# Patient Record
Sex: Male | Born: 1965
Health system: Southern US, Community
[De-identification: ages and names within clinical notes are randomized; demographics above are authoritative.]

## PROBLEM LIST (undated history)

## (undated) ENCOUNTER — Emergency Department: Admission: EM | Payer: Managed Care, Other (non HMO) | Source: Home / Self Care

## (undated) DIAGNOSIS — R809 Proteinuria, unspecified: Secondary | ICD-10-CM

## (undated) DIAGNOSIS — R030 Elevated blood-pressure reading, without diagnosis of hypertension: Secondary | ICD-10-CM

## (undated) DIAGNOSIS — F411 Generalized anxiety disorder: Secondary | ICD-10-CM

## (undated) DIAGNOSIS — J45909 Unspecified asthma, uncomplicated: Secondary | ICD-10-CM

## (undated) DIAGNOSIS — K429 Umbilical hernia without obstruction or gangrene: Secondary | ICD-10-CM

## (undated) DIAGNOSIS — L989 Disorder of the skin and subcutaneous tissue, unspecified: Secondary | ICD-10-CM

## (undated) DIAGNOSIS — E785 Hyperlipidemia, unspecified: Secondary | ICD-10-CM

## (undated) DIAGNOSIS — R972 Elevated prostate specific antigen [PSA]: Secondary | ICD-10-CM

## (undated) DIAGNOSIS — E119 Type 2 diabetes mellitus without complications: Secondary | ICD-10-CM

## (undated) DIAGNOSIS — K219 Gastro-esophageal reflux disease without esophagitis: Secondary | ICD-10-CM

## (undated) DIAGNOSIS — I2699 Other pulmonary embolism without acute cor pulmonale: Secondary | ICD-10-CM

## (undated) DIAGNOSIS — Z87442 Personal history of urinary calculi: Secondary | ICD-10-CM

## (undated) HISTORY — DX: Elevated prostate specific antigen (PSA): R97.20

## (undated) HISTORY — DX: Umbilical hernia without obstruction or gangrene: K42.9

## (undated) HISTORY — DX: Hyperlipidemia, unspecified: E78.5

## (undated) HISTORY — DX: Gastro-esophageal reflux disease without esophagitis: K21.9

## (undated) HISTORY — DX: Unspecified asthma, uncomplicated: J45.909

## (undated) HISTORY — DX: Generalized anxiety disorder: F41.1

## (undated) HISTORY — DX: Type 2 diabetes mellitus without complications: E11.9

## (undated) HISTORY — PX: OTHER SURGICAL HISTORY: SHX169

## (undated) HISTORY — DX: Disorder of the skin and subcutaneous tissue, unspecified: L98.9

## (undated) HISTORY — DX: Personal history of urinary calculi: Z87.442

## (undated) HISTORY — DX: Proteinuria, unspecified: R80.9

## (undated) HISTORY — DX: Elevated blood-pressure reading, without diagnosis of hypertension: R03.0

## (undated) HISTORY — DX: Other pulmonary embolism without acute cor pulmonale: I26.99

---

## 2004-09-22 ENCOUNTER — Ambulatory Visit: Payer: Self-pay | Admitting: Internal Medicine

## 2005-06-18 ENCOUNTER — Ambulatory Visit: Payer: Self-pay | Admitting: Internal Medicine

## 2007-02-20 ENCOUNTER — Ambulatory Visit: Payer: Self-pay | Admitting: Internal Medicine

## 2007-02-20 LAB — CONVERTED CEMR LAB
Albumin: 3.9 g/dL (ref 3.5–5.2)
Alkaline Phosphatase: 76 units/L (ref 39–117)
BUN: 19 mg/dL (ref 6–23)
Basophils Relative: 0.1 % (ref 0.0–1.0)
Cholesterol: 202 mg/dL (ref 0–200)
Crystals: NEGATIVE
Direct LDL: 131.5 mg/dL
GFR calc Af Amer: 106 mL/min
HDL: 31.4 mg/dL — ABNORMAL LOW (ref >39.0)
Hgb A1c MFr Bld: 7.9 % — ABNORMAL HIGH (ref 4.6–6.0)
Leukocytes, UA: NEGATIVE
Lymphocytes Relative: 31.4 % (ref 12.0–46.0)
Monocytes Relative: 10.8 % (ref 3.0–11.0)
Neutro Abs: 6 10*3/uL (ref 1.4–7.7)
PSA: 0.65 ng/mL (ref 0.10–4.00)
Platelets: 266 10*3/uL (ref 150–400)
Potassium: 4.5 meq/L (ref 3.5–5.1)
Specific Gravity, Urine: >=1.03 (ref 1.000–1.03)
Total CHOL/HDL Ratio: 6.4
Urine Glucose: 500 mg/dL — AB
Urobilinogen, UA: 0.2 (ref 0.0–1.0)
VLDL: 51 mg/dL — ABNORMAL HIGH (ref 0–40)
pH: 5.5 (ref 5.0–8.0)

## 2007-07-17 ENCOUNTER — Ambulatory Visit: Payer: Self-pay | Admitting: Internal Medicine

## 2007-07-17 DIAGNOSIS — E785 Hyperlipidemia, unspecified: Secondary | ICD-10-CM

## 2007-07-17 DIAGNOSIS — E119 Type 2 diabetes mellitus without complications: Secondary | ICD-10-CM

## 2007-07-17 DIAGNOSIS — K219 Gastro-esophageal reflux disease without esophagitis: Secondary | ICD-10-CM

## 2007-07-17 DIAGNOSIS — Z87442 Personal history of urinary calculi: Secondary | ICD-10-CM | POA: Insufficient documentation

## 2007-07-17 DIAGNOSIS — E1165 Type 2 diabetes mellitus with hyperglycemia: Secondary | ICD-10-CM | POA: Insufficient documentation

## 2007-07-17 DIAGNOSIS — R809 Proteinuria, unspecified: Secondary | ICD-10-CM | POA: Insufficient documentation

## 2007-07-17 DIAGNOSIS — F411 Generalized anxiety disorder: Secondary | ICD-10-CM | POA: Insufficient documentation

## 2007-07-17 HISTORY — DX: Type 2 diabetes mellitus without complications: E11.9

## 2007-07-17 HISTORY — DX: Proteinuria, unspecified: R80.9

## 2007-07-17 HISTORY — DX: Hyperlipidemia, unspecified: E78.5

## 2007-07-17 HISTORY — DX: Gastro-esophageal reflux disease without esophagitis: K21.9

## 2007-07-17 HISTORY — DX: Personal history of urinary calculi: Z87.442

## 2007-07-17 HISTORY — DX: Generalized anxiety disorder: F41.1

## 2007-07-21 LAB — CONVERTED CEMR LAB
BUN: 11 mg/dL (ref 6–23)
CO2: 27 meq/L (ref 19–32)
Chloride: 107 meq/L (ref 96–112)
Cholesterol: 184 mg/dL (ref 0–200)
Creatinine, Ser: 0.9 mg/dL (ref 0.4–1.5)
Direct LDL: 126 mg/dL
HDL: 35.8 mg/dL — ABNORMAL LOW (ref >39.0)
Hgb A1c MFr Bld: 7 % — ABNORMAL HIGH (ref 4.6–6.0)
Microalb, Ur: 0.3 mg/dL (ref 0.0–1.9)
Potassium: 4.2 meq/L (ref 3.5–5.1)
Sodium: 141 meq/L (ref 135–145)

## 2007-07-22 ENCOUNTER — Telehealth (INDEPENDENT_AMBULATORY_CARE_PROVIDER_SITE_OTHER): Payer: Self-pay | Admitting: *Deleted

## 2007-09-19 ENCOUNTER — Telehealth (INDEPENDENT_AMBULATORY_CARE_PROVIDER_SITE_OTHER): Payer: Self-pay | Admitting: *Deleted

## 2007-10-13 ENCOUNTER — Ambulatory Visit: Payer: Self-pay | Admitting: Internal Medicine

## 2007-10-13 DIAGNOSIS — R030 Elevated blood-pressure reading, without diagnosis of hypertension: Secondary | ICD-10-CM | POA: Insufficient documentation

## 2007-10-13 HISTORY — DX: Elevated blood-pressure reading, without diagnosis of hypertension: R03.0

## 2008-05-29 ENCOUNTER — Inpatient Hospital Stay: Payer: Self-pay | Admitting: Internal Medicine

## 2008-06-01 ENCOUNTER — Encounter: Payer: Self-pay | Admitting: Internal Medicine

## 2008-06-01 ENCOUNTER — Telehealth: Payer: Self-pay | Admitting: Internal Medicine

## 2008-06-02 ENCOUNTER — Ambulatory Visit: Payer: Self-pay | Admitting: Internal Medicine

## 2008-06-02 DIAGNOSIS — I2699 Other pulmonary embolism without acute cor pulmonale: Secondary | ICD-10-CM | POA: Insufficient documentation

## 2008-06-02 HISTORY — DX: Other pulmonary embolism without acute cor pulmonale: I26.99

## 2008-06-02 LAB — CONVERTED CEMR LAB: Prothrombin Time: 15.4 s — ABNORMAL HIGH (ref 10.9–13.3)

## 2008-06-04 ENCOUNTER — Ambulatory Visit: Payer: Self-pay | Admitting: Cardiology

## 2008-06-04 LAB — CONVERTED CEMR LAB: INR: 1.8 — ABNORMAL HIGH (ref 0.8–1.0)

## 2008-06-07 ENCOUNTER — Ambulatory Visit: Payer: Self-pay | Admitting: Cardiology

## 2008-06-10 ENCOUNTER — Ambulatory Visit: Payer: Self-pay | Admitting: Cardiology

## 2008-06-11 ENCOUNTER — Ambulatory Visit: Payer: Self-pay | Admitting: Internal Medicine

## 2008-06-14 ENCOUNTER — Ambulatory Visit: Payer: Self-pay | Admitting: Internal Medicine

## 2008-06-24 ENCOUNTER — Ambulatory Visit: Payer: Self-pay | Admitting: Cardiovascular Disease

## 2008-07-05 ENCOUNTER — Ambulatory Visit: Payer: Self-pay | Admitting: Cardiovascular Disease

## 2008-07-13 ENCOUNTER — Ambulatory Visit: Payer: Self-pay | Admitting: Cardiovascular Disease

## 2008-08-31 ENCOUNTER — Ambulatory Visit: Payer: Self-pay | Admitting: Internal Medicine

## 2008-09-01 LAB — CONVERTED CEMR LAB
BUN: 14 mg/dL (ref 6–23)
Calcium: 9.2 mg/dL (ref 8.4–10.5)
Chloride: 105 meq/L (ref 96–112)
Cholesterol: 142 mg/dL (ref 0–200)
Creatinine, Ser: 0.9 mg/dL (ref 0.4–1.5)
GFR calc Af Amer: 120 mL/min
GFR calc non Af Amer: 99 mL/min
Hgb A1c MFr Bld: 7.3 % — ABNORMAL HIGH (ref 4.6–6.0)
LDL Cholesterol: 75 mg/dL (ref 0–99)
Total CHOL/HDL Ratio: 3.8
Triglycerides: 149 mg/dL (ref 0–149)
VLDL: 30 mg/dL (ref 0–40)

## 2008-09-24 ENCOUNTER — Ambulatory Visit: Payer: Self-pay | Admitting: Internal Medicine

## 2008-10-11 ENCOUNTER — Telehealth: Payer: Self-pay | Admitting: Internal Medicine

## 2009-01-25 ENCOUNTER — Encounter: Payer: Self-pay | Admitting: *Deleted

## 2009-02-11 ENCOUNTER — Encounter (INDEPENDENT_AMBULATORY_CARE_PROVIDER_SITE_OTHER): Payer: Self-pay | Admitting: Cardiology

## 2009-02-11 ENCOUNTER — Ambulatory Visit: Payer: Self-pay | Admitting: Cardiology

## 2009-02-11 LAB — CONVERTED CEMR LAB: Protime: 19.5

## 2009-03-02 ENCOUNTER — Encounter: Payer: Self-pay | Admitting: *Deleted

## 2009-04-06 ENCOUNTER — Encounter: Payer: Self-pay | Admitting: Cardiology

## 2009-04-07 ENCOUNTER — Encounter (INDEPENDENT_AMBULATORY_CARE_PROVIDER_SITE_OTHER): Payer: Self-pay | Admitting: Cardiology

## 2009-05-13 ENCOUNTER — Encounter (INDEPENDENT_AMBULATORY_CARE_PROVIDER_SITE_OTHER): Payer: Self-pay | Admitting: *Deleted

## 2009-07-12 ENCOUNTER — Encounter (INDEPENDENT_AMBULATORY_CARE_PROVIDER_SITE_OTHER): Payer: Self-pay | Admitting: Pharmacist

## 2009-09-16 ENCOUNTER — Encounter (INDEPENDENT_AMBULATORY_CARE_PROVIDER_SITE_OTHER): Payer: Self-pay | Admitting: Cardiology

## 2009-09-22 ENCOUNTER — Encounter: Payer: Self-pay | Admitting: Internal Medicine

## 2009-09-22 ENCOUNTER — Telehealth (INDEPENDENT_AMBULATORY_CARE_PROVIDER_SITE_OTHER): Payer: Self-pay | Admitting: *Deleted

## 2009-10-03 ENCOUNTER — Ambulatory Visit: Payer: Self-pay | Admitting: Internal Medicine

## 2009-10-03 DIAGNOSIS — R972 Elevated prostate specific antigen [PSA]: Secondary | ICD-10-CM | POA: Insufficient documentation

## 2009-10-03 HISTORY — DX: Elevated prostate specific antigen (PSA): R97.20

## 2009-10-04 ENCOUNTER — Telehealth (INDEPENDENT_AMBULATORY_CARE_PROVIDER_SITE_OTHER): Payer: Self-pay | Admitting: *Deleted

## 2009-10-04 LAB — CONVERTED CEMR LAB
Albumin: 4.3 g/dL (ref 3.5–5.2)
Alkaline Phosphatase: 80 units/L (ref 39–117)
BUN: 13 mg/dL (ref 6–23)
Basophils Absolute: 0 10*3/uL (ref 0.0–0.1)
Basophils Relative: 0.2 % (ref 0.0–3.0)
Bilirubin, Direct: 0.1 mg/dL (ref 0.0–0.3)
CO2: 26 meq/L (ref 19–32)
Calcium: 9.1 mg/dL (ref 8.4–10.5)
Chloride: 107 meq/L (ref 96–112)
Creatinine, Ser: 0.8 mg/dL (ref 0.4–1.5)
Creatinine,U: 52.8 mg/dL
Eosinophils Absolute: 0.1 10*3/uL (ref 0.0–0.7)
Glucose, Bld: 145 mg/dL — ABNORMAL HIGH (ref 70–99)
HDL: 36.8 mg/dL — ABNORMAL LOW (ref >39.00)
Lymphocytes Relative: 19.9 % (ref 12.0–46.0)
MCHC: 33.4 g/dL (ref 30.0–36.0)
MCV: 94.4 fL (ref 78.0–100.0)
Microalb Creat Ratio: 24.6 mg/g (ref 0.0–30.0)
Monocytes Absolute: 0.8 10*3/uL (ref 0.1–1.0)
Neutrophils Relative %: 70.9 % (ref 43.0–77.0)
Nitrite: NEGATIVE
RDW: 12 % (ref 11.5–14.6)
Specific Gravity, Urine: 1.015 (ref 1.000–1.030)
TSH: 0.81 microintl units/mL (ref 0.35–5.50)
Total CHOL/HDL Ratio: 4
Total Protein: 7.5 g/dL (ref 6.0–8.3)
Triglycerides: 146 mg/dL (ref 0.0–149.0)
Urobilinogen, UA: 0.2 (ref 0.0–1.0)
VLDL: 29.2 mg/dL (ref 0.0–40.0)

## 2009-11-07 ENCOUNTER — Encounter: Payer: Self-pay | Admitting: Internal Medicine

## 2010-01-10 ENCOUNTER — Encounter: Payer: Self-pay | Admitting: Internal Medicine

## 2010-02-20 IMAGING — US US EXTREM LOW VENOUS BILAT
1 series · 17 of 24 positions shown · non-contrast
Comparison: none

REASON FOR EXAM: sob, edema
COMMENTS:

PROCEDURE:     US  - US DOPPLER LOW EXTR BILATERAL  - May 29, 2008  [DATE]
RESULT:     Comparison: None
INDICATION: Short of breath

[Series 1: us extrem low venous bilat · 17 of 46 slices shown]
[im 1/46]
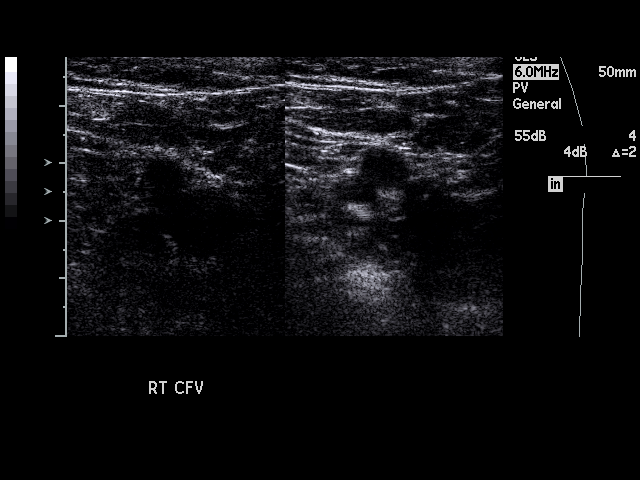
[im 4/46]
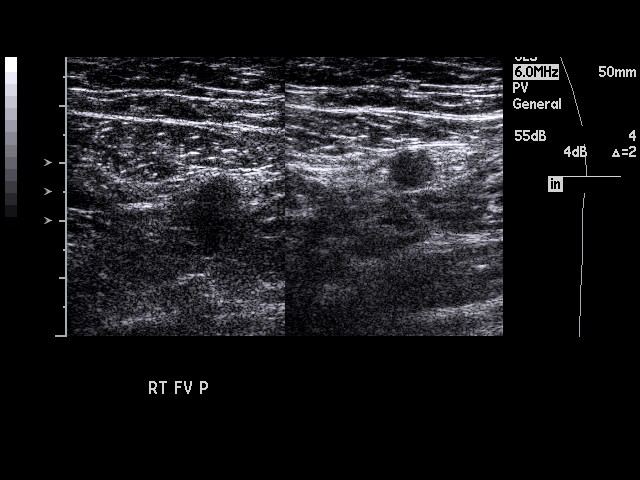
[im 6/46]
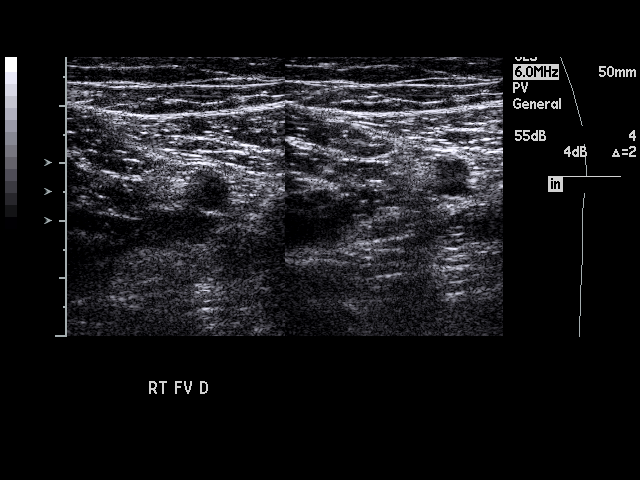
[im 8/46]
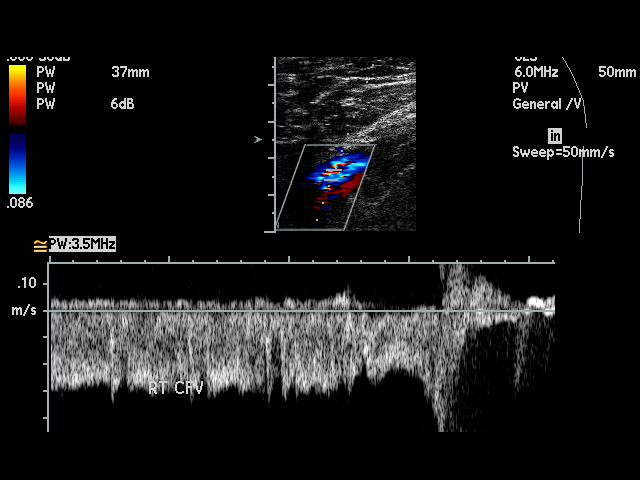
[im 12/46]
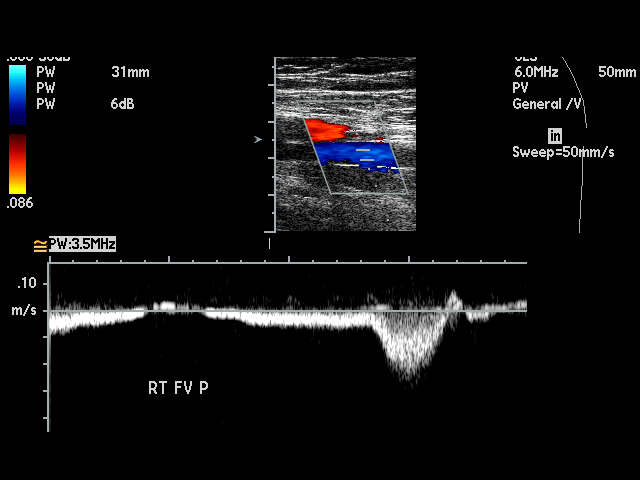
[im 14/46]
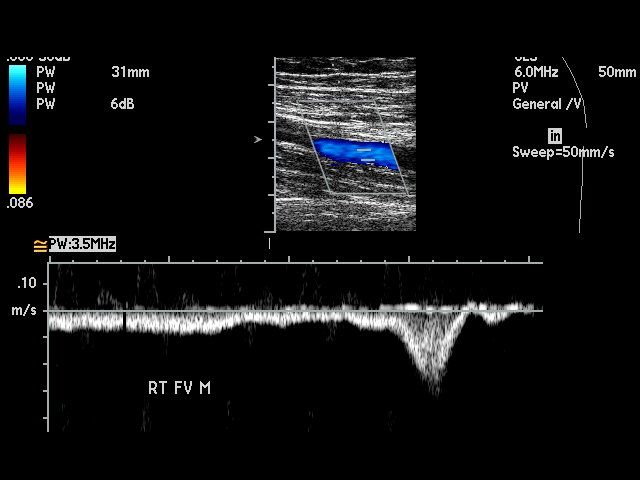
[im 18/46]
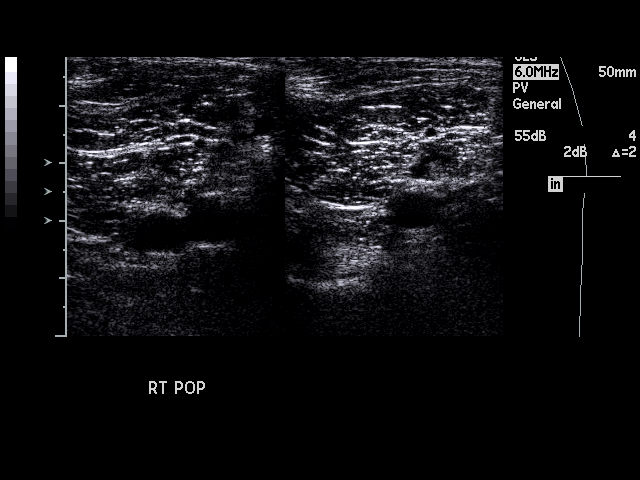
[im 20/46]
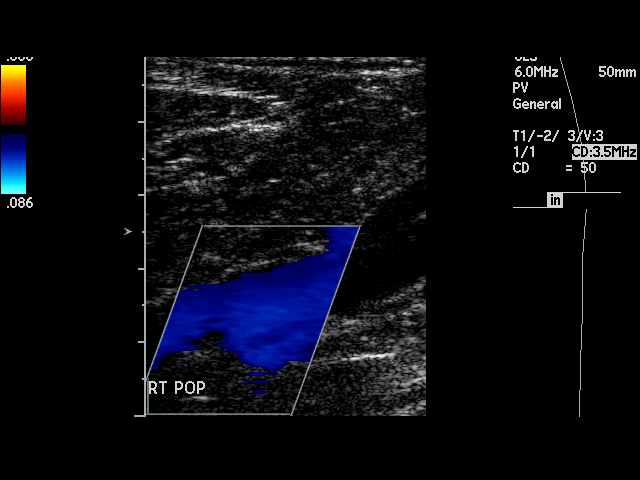
[im 24/46]
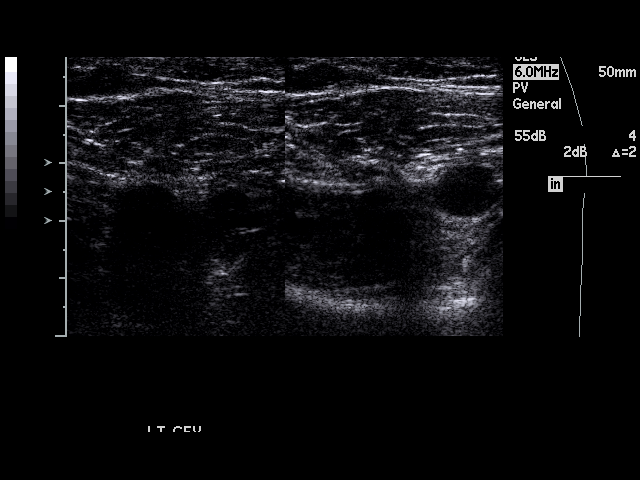
[im 26/46]
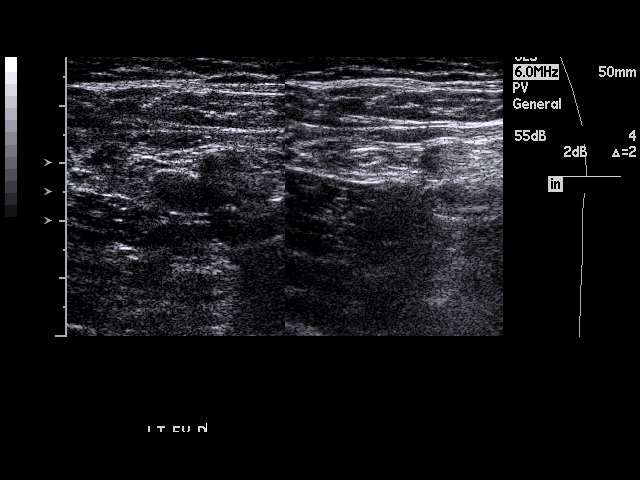
[im 28/46]
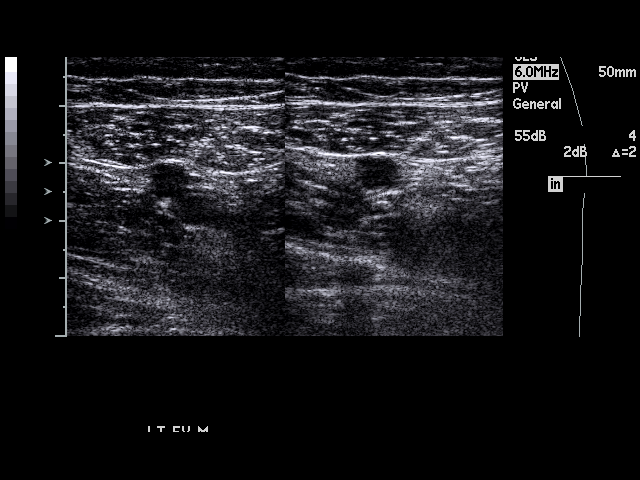
[im 32/46]
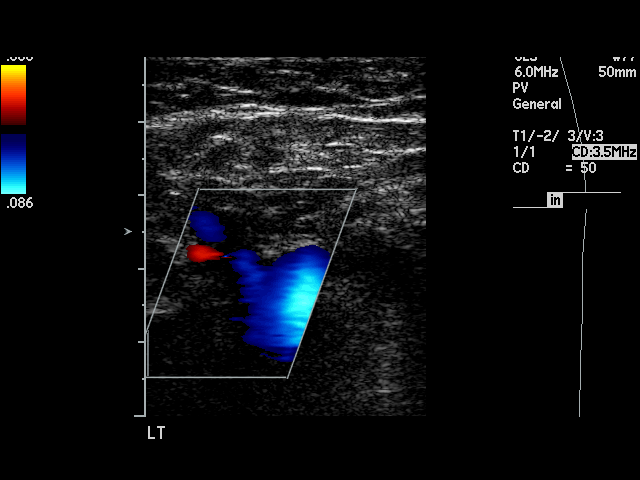
[im 34/46]
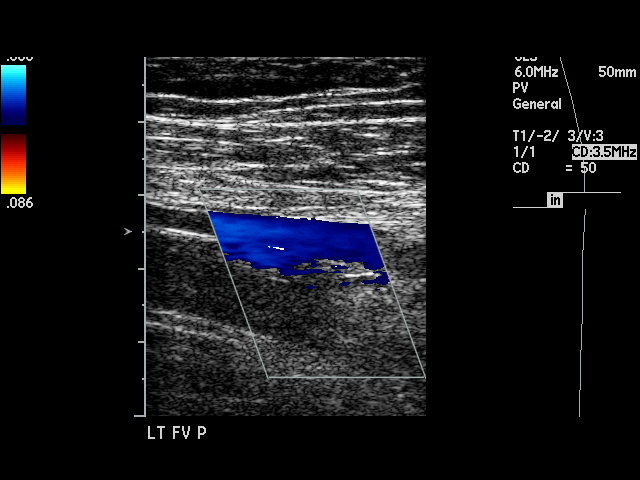
[im 38/46]
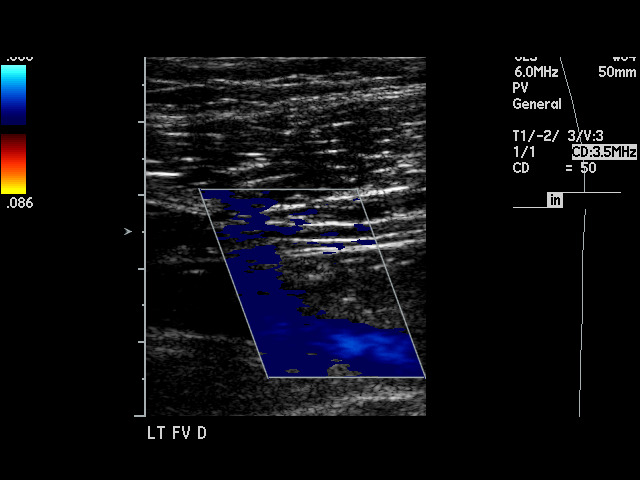
[im 40/46]
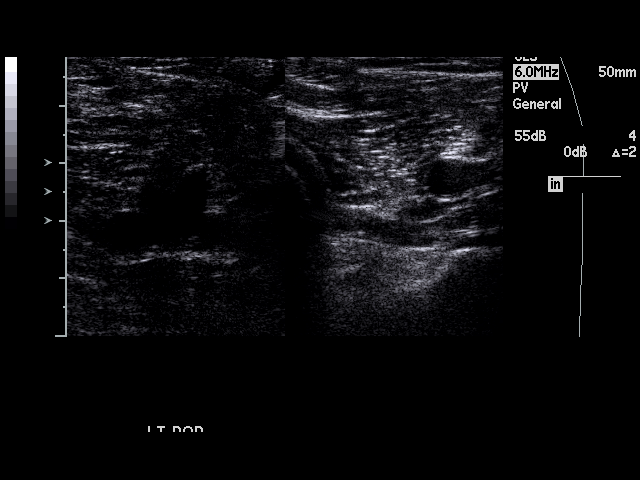
[im 42/46]
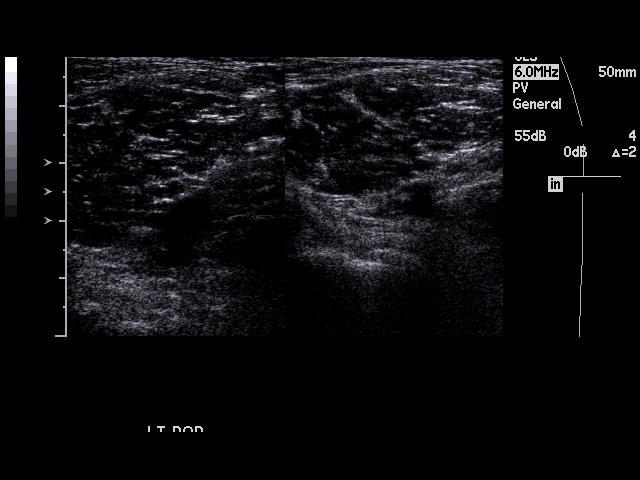
[im 46/46]
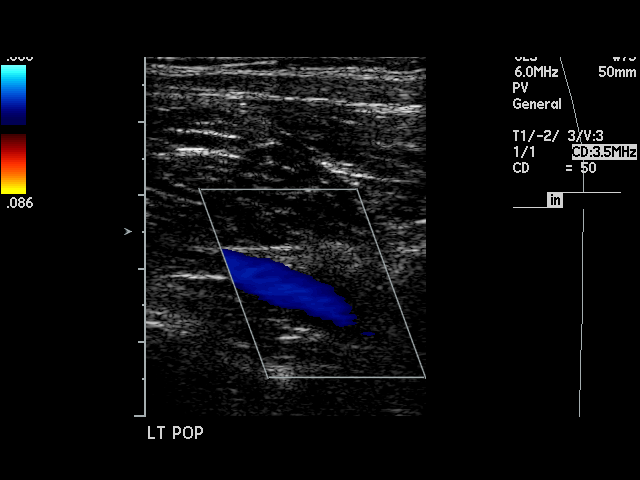

[17 of 24 positions shown; findings below may reference images not displayed]

FINDINGS: Multiple longitudinal and transverse gray-scale as well as color
and spectral Doppler images of the bilateral lower extremity veins were
obtained from the common femoral veins through the popliteal veins.

The right common femoral, greater saphenous, femoral, popliteal veins, and
venous trifurcation are patent, demonstrating normal color-flow and
compressibility. No intraluminal thrombus is identified.There is normal
respiratory variation and augmentation demonstrated at all levels.

The left common femoral, greater saphenous, femoral, popliteal veins, and
venous trifurcation are patent, demonstrating normal color-flow and
compressibility. No intraluminal thrombus is identified.There is normal
respiratory variation and augmentation demonstrated at all levels.
IMPRESSION: No evidence of DVT in the bilateral lower extremities.

## 2010-04-03 ENCOUNTER — Ambulatory Visit: Payer: Self-pay | Admitting: Internal Medicine

## 2010-08-03 ENCOUNTER — Ambulatory Visit: Payer: Self-pay | Admitting: Internal Medicine

## 2010-08-04 ENCOUNTER — Encounter: Payer: Self-pay | Admitting: Internal Medicine

## 2010-08-04 ENCOUNTER — Ambulatory Visit: Payer: Self-pay | Admitting: Internal Medicine

## 2010-08-04 DIAGNOSIS — L989 Disorder of the skin and subcutaneous tissue, unspecified: Secondary | ICD-10-CM

## 2010-08-04 HISTORY — DX: Disorder of the skin and subcutaneous tissue, unspecified: L98.9

## 2010-08-08 LAB — CONVERTED CEMR LAB
BUN: 14 mg/dL (ref 6–23)
CO2: 27 meq/L (ref 19–32)
Calcium: 9.4 mg/dL (ref 8.4–10.5)
GFR calc non Af Amer: 83.55 mL/min (ref >60.00)
Glucose, Bld: 132 mg/dL — ABNORMAL HIGH (ref 70–99)
HDL: 32.5 mg/dL — ABNORMAL LOW (ref >39.00)
Hgb A1c MFr Bld: 7.7 % — ABNORMAL HIGH (ref 4.6–6.5)
Sodium: 138 meq/L (ref 135–145)
Total CHOL/HDL Ratio: 4
VLDL: 33.8 mg/dL (ref 0.0–40.0)

## 2010-09-26 NOTE — Progress Notes (Signed)
       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  Follow-up by: Bethena Midget, RN, BSN,  September 22, 2009 2:25 PM

## 2010-09-26 NOTE — Medication Information (Signed)
Summary: [facility]  Anticoagulant Therapy  Managed by: Inactive Referring MD: Corwin Levins MD Supervising MD: Graciela Husbands MD, Viviann Spare Indication 1: Deep Vein Thrombosis - Leg (ICD-451.1) Indication 2: Pulmonary Embolism and Infarction (ICD-415.1) Lab Used: [facility] Site: [facility] INR RANGE 2 - 3          Comments: Called pt, hasn't had PT/INR done since 8/10.  Pt states he stopped coumadin in October 2010, and feels fine.  Advised pt of risks assoc with stopping coumadin including clot and death.  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  Pt states he is unable to physically follow-up.  Will forward copy of note to Dr Jonny Ruiz to make aware.  Cloyde Reams RN  September 22, 2009 2:50 PM   Allergies: No Known Drug Allergies  Anticoagulation Management History:      Positive risk factors for bleeding include presence of serious comorbidities.  Negative risk factors for bleeding include an age less than 2 years old.  The bleeding index is 'intermediate risk'.  Positive CHADS2 values include History of Diabetes.  Negative CHADS2 values include Age > 82 years old.  The start date was 06/02/2008.  His last INR was 4.8 RATIO.  Anticoagulation responsible provider: Graciela Husbands MD, Viviann Spare.  Exp: 02/2010.    Anticoagulation Management Assessment/Plan:      The patient's current anticoagulation dose is Coumadin 5 mg tabs: use asd by mouth once daily.  The target INR is 2 - 3.  The next INR is due 03/04/2009.  Anticoagulation instructions were given to patient.  Results were reviewed/authorized by Inactive.         Prior Anticoagulation Instructions: INR today 2.6  Continue regular schedule of 2 tablets (10mg ) daily.

## 2010-09-26 NOTE — Assessment & Plan Note (Signed)
Summary: FU---STC   Vital Signs:  Patient profile:   45 year old male Height:      71 inches Weight:      232 pounds BMI:     28.00 O2 Sat:      95 % on Room air Temp:     96.9 degrees F oral Pulse rate:   92 / minute BP sitting:   128 / 82  (left arm) Cuff size:   large  Vitals Entered ByZella Ball Ewing (October 03, 2009 8:56 AM)  O2 Flow:  Room air  CC: followup/RE   CC:  followup/RE.  History of Present Illness: ran out of meds one month ago ;  Pt denies CP, sob, doe, wheezing, orthopnea, pnd, worsening LE edema, palps, dizziness or syncope  Pt denies new neuro symptoms such as headache, facial or extremity weakness   Pt denies polydipsia, polyuria, or low sugar symptoms such as shakiness improved with eating.  Overall good compliance with meds, trying to follow low chol, DM diet, wt stable, little excercise however Still driving long distance.  No other new complaints at this time  Preventive Screening-Counseling & Management      Drug Use:  no.    Problems Prior to Update: 1)  Special Screening Malig Neoplasms Other Sites  (ICD-V76.49) 2)  Preventive Health Care  (ICD-V70.0) 3)  Pe  (ICD-415.19) 4)  Elevated Blood Pressure Without Diagnosis of Hypertension  (ICD-796.2) 5)  Anxiety  (ICD-300.00) 6)  Gerd  (ICD-530.81) 7)  Nephrolithiasis, Hx of  (ICD-V13.01) 8)  Proteinuria  (ICD-791.0) 9)  Family History Diabetes 1st Degree Relative  (ICD-V18.0) 10)  Hyperlipidemia  (ICD-272.4) 11)  Diabetes Mellitus, Type II  (ICD-250.00)  Medications Prior to Update: 1)  Simvastatin 40 Mg  Tabs (Simvastatin) .Marland Kitchen.. 1po Qd 2)  Metformin Hcl 500 Mg  Tabs (Metformin Hcl) .... 2 By Mouth Two Times A Day 3)  Onetouch Ultra Test   Strp (Glucose Blood) .... Use Asd Qid 4)  Lancets   Misc (Lancets) .... Use Asd Qid 5)  Coumadin 5 Mg Tabs (Warfarin Sodium) .... Use Asd By Mouth Once Daily  Current Medications (verified): 1)  Simvastatin 40 Mg  Tabs (Simvastatin) .Marland Kitchen.. 1po Once Daily 2)   Metformin Hcl 500 Mg  Tabs (Metformin Hcl) .... 2 By Mouth Two Times A Day 3)  Onetouch Ultra Test   Strp (Glucose Blood) .... Use Asd Qid 4)  Lancets   Misc (Lancets) .... Use Asd Qid 5)  Aspir-Low 81 Mg Tbec (Aspirin) .Marland Kitchen.. 1 By Mouth Once Daily  Allergies (verified): No Known Drug Allergies  Past History:  Past Medical History: Last updated: 08/31/2008 Diabetes mellitus, type II Hyperlipidemia Nephrolithiasis, hx of GERD Anxiety ? 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         all otherwise negative per pt -   Physical Exam  General:  alert and overweight-appearing.   Head:  normocephalic and atraumatic.   Eyes:  vision grossly intact, pupils equal, and pupils round.   Ears:  R ear normal and L ear normal.   Nose:  no  external deformity and no nasal discharge.   Mouth:  no gingival abnormalities and pharynx pink and moist.   Neck:  supple and no masses.   Lungs:  normal respiratory effort and normal breath sounds.   Heart:  normal rate and regular rhythm.   Abdomen:  soft, non-tender, and normal bowel sounds, has rather large but nontender umbilical hernia and easily reducible Msk:  no joint tenderness and no joint swelling.   Extremities:  no edema, no erythema  Neurologic:  cranial nerves II-XII intact and strength normal in all extremities.     Impression & Recommendations:  Problem # 1:  Preventive Health Care (ICD-V70.0)  Overall doing well, age appropriate education and counseling updated and referral for appropriate preventive services done unless declined, immunizations up to date or declined, diet counseling done if overweight, urged to quit smoking if smokes , most recent labs reviewed and current ordered if appropriate, ecg reviewed or declined (interpretation per ECG scanned in the EMR if done); information regarding Medicare Prevention requirements given if appropriate   Orders: TLB-BMP (Basic Metabolic Panel-BMET) (80048-METABOL) TLB-CBC Platelet - w/Differential (85025-CBCD) TLB-Hepatic/Liver Function Pnl (80076-HEPATIC) TLB-Lipid Panel (80061-LIPID) TLB-TSH (Thyroid Stimulating Hormone) (84443-TSH) TLB-PSA (Prostate Specific Antigen) (84153-PSA) TLB-Udip ONLY (81003-UDIP)  Problem # 2:  DIABETES MELLITUS, TYPE II (ICD-250.00)  His updated medication list for this problem includes:    Metformin Hcl 500 Mg Tabs (Metformin hcl) .Marland Kitchen... 2 by mouth two times a day    Aspir-low 81 Mg Tbec (Aspirin) .Marland Kitchen... 1 by mouth once daily  Orders: TLB-Microalbumin/Creat Ratio, Urine (82043-MALB) TLB-A1C / Hgb A1C (Glycohemoglobin) (83036-A1C)  Labs Reviewed: Creat: 0.9 (08/31/2008)    Reviewed HgBA1c results: 7.3 (08/31/2008)  7.0 (07/17/2007) stable overall by hx and exam, ok to continue  meds/tx as is , Pt to cont DM diet, excercise, wt loss efforts; to check labs today   Problem # 3:  ELEVATED BLOOD PRESSURE WITHOUT DIAGNOSIS OF HYPERTENSION (ICD-796.2)  BP today: 128/82 Prior BP: 136/88 (08/31/2008)  Labs Reviewed: Creat: 0.9 (08/31/2008) Chol: 142 (08/31/2008)   HDL: 37.6 (08/31/2008)   LDL: 75 (08/31/2008)   TG: 149 (08/31/2008)  Instructed in low sodium diet (DASH Handout) and behavior modification.   stable overall by hx and exam, ok to continue meds/tx as is   Problem # 4:  HYPERLIPIDEMIA (ICD-272.4)  His updated medication list for this problem includes:    Simvastatin 40 Mg Tabs (Simvastatin) .Marland Kitchen... 1po once daily  Labs Reviewed: SGOT: 29 (02/20/2007)   SGPT: 35 (02/20/2007)   HDL:37.6 (08/31/2008), 35.8 (07/17/2007)  LDL:75 (08/31/2008), DEL (16/05/9603)  Chol:142 (08/31/2008), 184 (07/17/2007)  Trig:149 (08/31/2008), 253 (07/17/2007) to re-start meds today, Pt to continue diet efforts, good med tolerance before he ran out; to check labs - goal LDL less than 70   Complete Medication List: 1)  Simvastatin 40 Mg Tabs (Simvastatin) .Marland Kitchen.. 1po once daily 2)  Metformin Hcl 500 Mg Tabs (Metformin hcl) .... 2  by mouth two times a day 3)  Onetouch Ultra Test Strp (Glucose blood) .... Use asd qid 4)  Lancets Misc (Lancets) .... Use asd qid 5)  Aspir-low 81 Mg Tbec (Aspirin) .Marland Kitchen.. 1 by mouth once daily  Other Orders: Admin 1st Vaccine ([address]) Flu Vaccine 62yrs + [id number])  Patient Instructions: 1)  you had the flu shot today 2)  Take an Aspirin every day - 81 mg - 1 per day - COATED only 3)  Continue all previous medications as before this visit (no coumadin needed at this time) 4)  you are given the refills today 5)  Please go to the Lab in the basement for your blood and/or urine tests today  6)  Please schedule a follow-up appointment in 6 months with : 7)  BMP prior to visit, ICD-9: 250.02 8)  Lipid Panel prior to visit, ICD-9: 9)  HbgA1C prior to visit,  ICD-9: Prescriptions: LANCETS   MISC (LANCETS) use asd qid  #400 x 11   Entered and Authorized by:   Corwin Levins MD   Signed by:   Corwin Levins MD on 10/03/2009   Method used:   Print then Give to Patient   RxID:   [id number] ONETOUCH ULTRA TEST   STRP (GLUCOSE BLOOD) use asd qid  #400 x 11   Entered and Authorized by:   Corwin Levins MD   Signed by:   Corwin Levins MD on 10/03/2009   Method used:   Print then Give to Patient   RxID:   [id number] SIMVASTATIN 40 MG  TABS (SIMVASTATIN) 1po once daily  #90 x 3   Entered and Authorized by:   Corwin Levins MD   Signed by:   Corwin Levins MD on 10/03/2009   Method used:   Print then Give to Patient   RxID:   [id number] METFORMIN HCL 500 MG  TABS (METFORMIN HCL) 2 by mouth two times a day  #360 x 3   Entered and Authorized by:   Corwin Levins MD   Signed by:   Corwin Levins MD on 10/03/2009   Method used:   Print then Give to Patient   RxID:   [id number] METFORMIN HCL 500 MG  TABS (METFORMIN HCL) 2 by mouth two times a day  #120 x 11   Entered and Authorized by:   Corwin Levins MD   Signed by:   Corwin Levins MD on 10/03/2009   Method used:   Print then Give to Patient   RxID:   [id number] SIMVASTATIN 40 MG  TABS (SIMVASTATIN) 1po once daily  #30 x 11   Entered and Authorized by:   Corwin Levins MD   Signed by:   Corwin Levins MD on 10/03/2009   Method used:   Print then Give to Patient   RxID:   [id number]   Flu Vaccine Consent Questions     Do you have a history of severe allergic reactions to this vaccine? no    Any prior history of allergic reactions to egg and/or gelatin? no    Do you have a sensitivity to the preservative Thimersol? no    Do you have a past history of Guillan-Barre Syndrome? no    Do you currently have an acute febrile illness? no    Have you ever had a severe reaction to latex? no    Vaccine information given and explained to patient? yes    Are you  currently pregnant? no    Lot  Number:[id number]   Exp Date:02/23/2010   Site Given  Left Deltoid IMlbflu

## 2010-09-26 NOTE — Progress Notes (Signed)
----   Converted from flag ---- ---- 10/04/2009 11:47 AM, Margaret Pyle, CMA wrote: pt called stating that he does not remember what you spoke with him about this morning. pt is requesting that you call his wife at [phone] and explain to her, thanx! ------------------------------  Called pt back and informed of labs. To addd Glimeparide 1mg , 1/2 once daily and that PSA was elevated and he would be referred to a Urologist. Patient agreed to all.

## 2010-09-26 NOTE — Assessment & Plan Note (Signed)
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  History of Present Illness: here to f/u - only takig metformin 500 two times a day due to dizziness when working when taking 2 two times a day.  Lost 5 bls since feb 2011; peak wt down from 240 with better diet, so stopped the glimeparide as well.  Wife sends note and pt requests wellbutrin to help quit smoking since chantix did not help. Denies worsening depressive symptoms, suicidal ideation, or panic.   Pt denies CP, worsening sob, doe, wheezing, orthopnea, pnd, worsening LE edema, palps, dizziness or syncope  Pt denies new neuro symptoms such as headache, facial or extremity weakness  Pt denies polydipsia, polyuria, or low sugar symptoms such as shakiness improved with eating.  Overall good compliance with meds, trying to follow low chol, DM diet, wt stable, little excercise however  Also with lump to left upper chest near the left base of the neck for > 4 mo without change, nontender, no drainage, no fever and No fever, wt loss, night sweats, loss of appetite or other constitutional symptoms   Problems Prior to Update: 1)  Skin Lesion  (ICD-709.9) 2)  Psa, Increased  (ICD-790.93) 3)  Special Screening Malig Neoplasms Other Sites  (ICD-V76.49) 4)  Preventive Health Care  (ICD-V70.0) 5)  Pe  (ICD-415.19) 6)  Elevated Blood Pressure Without Diagnosis of Hypertension  (ICD-796.2) 7)  Anxiety  (ICD-300.00) 8)  Gerd  (ICD-530.81) 9)  Nephrolithiasis, Hx of  (ICD-V13.01) 10)  Proteinuria  (ICD-791.0) 11)   Family History Diabetes 1st Degree Relative  (ICD-V18.0) 12)  Hyperlipidemia  (ICD-272.4) 13)  Diabetes Mellitus, Type II  (ICD-250.00)  Medications Prior to Update: 1)  Simvastatin 40 Mg  Tabs (Simvastatin) .Marland Kitchen.. 1po Once Daily 2)  Metformin Hcl 500 Mg  Tabs (Metformin Hcl) .... 2 By Mouth Two Times A Day 3)  Onetouch Ultra Test   Strp (Glucose Blood) .... Use Asd Qid 4)  Lancets   Misc (Lancets) .... Use Asd Qid 5)  Aspir-Low 81 Mg Tbec (Aspirin) .Marland Kitchen.. 1 By Mouth Once Daily 6)  Glimepiride 1 Mg Tabs (Glimepiride) .... 1/2 By Mouth Once Daily  Current Medications (verified): 1)  Simvastatin 40 Mg  Tabs (Simvastatin) .Marland Kitchen.. 1po Once Daily 2)  Metformin Hcl 500 Mg  Tabs (Metformin Hcl) .Marland Kitchen.. 1 By Mouth Two Times A Day 3)  Onetouch Ultra Test   Strp (Glucose Blood) .... Use Asd Qid 4)  Lancets   Misc (Lancets) .... Use Asd Qid 5)  Aspir-Low 81 Mg Tbec (Aspirin) .Marland Kitchen.. 1 By Mouth Once Daily  Allergies (verified): No Known Drug Allergies  Past History:  Past Medical History: Last updated: 08/31/2008 Diabetes mellitus, type II Hyperlipidemia Nephrolithiasis, hx of GERD Anxiety ? 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   Head:  normocephalic and atraumatic.   Eyes:  vision grossly intact, pupils equal, and pupils round.   Ears:  R ear normal and L ear normal.   Nose:  no external deformity and no nasal discharge.   Mouth:  no gingival abnormalities and pharynx pink  and moist.   Neck:  supple and no masses.   Lungs:  normal respiratory effort and normal breath sounds.   Heart:  normal rate and regular rhythm.   Extremities:  no edema, no erythema  Skin:  left upper chest wall near the left sternoclavicular joint approx 2 cm , subq , nontender, regular, firm but not hard, mobile, noncystic adn nondrainage   Impression & Recommendations:  Problem # 1:  DIABETES MELLITUS, TYPE II (ICD-250.00) Assessment Deteriorated  The following medications were removed from the medication list:    Glimepiride 1 Mg Tabs (Glimepiride) .Marland Kitchen... 1/2 by mouth once daily His updated medication list for this problem includes:    Metformin Hcl 500 Mg Tabs (Metformin hcl) .Marland Kitchen... 1 by mouth two times a day    Aspir-low 81 Mg Tbec (Aspirin) .Marland Kitchen... 1 by mouth once daily  Labs Reviewed: Creat: 0.8 (10/03/2009)    Reviewed HgBA1c results: 7.4 (10/03/2009)  7.3 (08/31/2008)  Orders: EKG w/ Interpretation ([address]) TLB-BMP (Basic Metabolic Panel-BMET) (80048-METABOL) TLB-Lipid Panel (80061-LIPID) TLB-A1C / Hgb A1C (Glycohemoglobin) (83036-A1C) oerall likely mild uncontrolled, Pt to cont DM diet, excercise, wt control efforts; to check labs today   Problem # 2:  HYPERLIPIDEMIA (ICD-272.4) Assessment: Unchanged  His updated medication list for this problem includes:    Simvastatin 40 Mg Tabs (Simvastatin) .Marland Kitchen... 1po once daily  Labs Reviewed: SGOT: 20 (10/03/2009)   SGPT: 35 (10/03/2009)   HDL:36.80 (10/03/2009), 37.6 (08/31/2008)  LDL:82 (10/03/2009), 75 (08/31/2008)  Chol:148 (10/03/2009), 142 (08/31/2008)  Trig:146.0 (10/03/2009), 149 (08/31/2008) stable overall by hx and exam, ok to continue meds/tx as is   Problem # 3:  ELEVATED BLOOD PRESSURE WITHOUT DIAGNOSIS OF HYPERTENSION (ICD-796.2) Assessment: Unchanged  BP today: 130/90 Prior BP: 128/82 (10/03/2009)  Labs Reviewed: Creat: 0.8 (10/03/2009) Chol: 148 (10/03/2009)   HDL: 36.80 (10/03/2009)   LDL: 82  (10/03/2009)   TG: 146.0 (10/03/2009)  Instructed in low sodium diet (DASH Handout) and behavior modification.   stable overall by hx and exam, ok to continue meds/tx as is  - to cont wt loss efforts  Problem # 4:  SKIN LESION (ICD-709.9) Assessment: New rather large left upper chest near the left sternoclavicular  ,but appears benign , I suggested surgury consult but he declines at this time due to tyring to cont work adn finances;  also has [place] hernia that needs repair  Complete Medication List: 1)  Simvastatin 40 Mg Tabs (Simvastatin) .Marland Kitchen.. 1po once daily 2)  Metformin Hcl 500 Mg Tabs (Metformin hcl) .Marland Kitchen.. 1 by mouth two times a day 3)  Onetouch Ultra Test Strp (Glucose blood) .... Use asd qid 4)  Lancets Misc (Lancets) .... Use asd qid 5)  Aspir-low 81 Mg Tbec (Aspirin) .Marland Kitchen.. 1 by  mouth once daily  Patient Instructions: 1)  continue the metformin at 500 mg two times a day for now 2)  Please go to the Lab in the basement for your blood and/or urine tests today 3)  Please call the number on the [facility] Card for results of your testing  4)  we can consider changing the metformin to janumet if needed 5)  Please schedule a follow-up appointment in 3 months for CPX with labs and: 6)  HbgA1C prior to visit, ICD-9: 250.02 7)  Urine Microalbumin prior to visit, ICD-9:   Orders Added: 1)  EKG w/ Interpretation [93000] 2)  EKG w/ Interpretation [93000] 3)  TLB-BMP (Basic Metabolic Panel-BMET) [80048-METABOL] 4)  TLB-Lipid Panel [80061-LIPID] 5)  TLB-A1C / Hgb A1C (Glycohemoglobin) [83036-A1C] 6)  Est. Patient Level IV [[address]]

## 2010-09-26 NOTE — Letter (Signed)
Summary: [facility] - Delinquent Coumadin 1  [facility], Main Office  [address]   [place], [place] [address]   Phone: [phone]  Fax: [phone]     May 13, 2009 MRN: [id number]   FAROUK VIVERO [address] [place], [place]  [address]   Dear Mr. MINCHEY,  This letter is being sent to you as a reminder that it is necessary for you to get your INR/PT checked regularly so that we can optimize your care.  Our records indicate that you were scheduled to have a test done recently.  As of today, we have not received the results of this test.  It is very important that you have your INR checked.  Please call our office at the number listed above to schedule an appointment at your earliest convenience.    If you have recently had your protime checked or have discontinued this medication, please contact our office at the above phone number to clarify this issue.  Thank you for this prompt attention to this important health care matter.  Sincerely,   [facility] Team

## 2010-09-26 NOTE — Consult Note (Signed)
Summary: [facility]  [facility]   Imported By: Lester Herminie 11/11/2009 09:54:35  _____________________________________________________________________  External Attachment:    Type:   Image     Comment:   External Document

## 2010-09-26 NOTE — Letter (Signed)
Summary: [facility] - Delinquent Coumadin 2  Coumadin  [name]   [place], [place] [address]   Phone: [phone]  Fax: [phone]     September 16, 2009 MRN: [id number]   Bradley Henson [address] [place], [place]  [address]   Dear [name],  We have attempted to contact you by phone and letter on multiple occasions to contact our office for important blood work associated with the blood thinner, warfarin (Coumadin).  Warfarin is a very important drug that can cause life threatening side effects including, bleeding, and thus requires close laboratory monitoring.  We are unable to accept responsibility for blood thinner-related health problems you may develop because you have not followed our recommendations for appropriate monitoring.  These may include abnormal bleeding occurrences and/or development of blood clots (stroke, heart attack, blood clots in legs or lungs, etc.).  We need for you to contact this office at the number listed above to schedule and complete this very important blood work.  Thank you for your assistance in this urgent matter.  Sincerely,  [facility] Team  Appended Document: [facility] - Delinquent Coumadin 2 Attempted to call pt LMOM to call back to schedule follow-up.  Mailed delinquent coumadin f/u letter to pt's home address.  Called [facility], [place] [address] and pulled all coumadin refills. Pt has not been seen in coumadin clinic since 6/10!  EWJ

## 2010-09-28 NOTE — Letter (Signed)
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

## 2010-10-20 ENCOUNTER — Other Ambulatory Visit: Payer: Self-pay

## 2010-10-27 ENCOUNTER — Other Ambulatory Visit: Payer: PRIVATE HEALTH INSURANCE

## 2010-10-27 ENCOUNTER — Ambulatory Visit (INDEPENDENT_AMBULATORY_CARE_PROVIDER_SITE_OTHER)
Admission: RE | Admit: 2010-10-27 | Discharge: 2010-10-27 | Disposition: A | Discharge status: Home / Self Care | Payer: PRIVATE HEALTH INSURANCE | Source: Ambulatory Visit | Attending: Internal Medicine | Admitting: Internal Medicine

## 2010-10-27 ENCOUNTER — Encounter (INDEPENDENT_AMBULATORY_CARE_PROVIDER_SITE_OTHER): Payer: PRIVATE HEALTH INSURANCE | Admitting: Internal Medicine

## 2010-10-27 ENCOUNTER — Other Ambulatory Visit: Payer: Self-pay | Admitting: Internal Medicine

## 2010-10-27 ENCOUNTER — Encounter: Payer: Self-pay | Admitting: Internal Medicine

## 2010-10-27 DIAGNOSIS — L989 Disorder of the skin and subcutaneous tissue, unspecified: Secondary | ICD-10-CM

## 2010-10-27 DIAGNOSIS — E119 Type 2 diabetes mellitus without complications: Secondary | ICD-10-CM

## 2010-10-27 DIAGNOSIS — J45909 Unspecified asthma, uncomplicated: Secondary | ICD-10-CM

## 2010-10-27 DIAGNOSIS — E785 Hyperlipidemia, unspecified: Secondary | ICD-10-CM

## 2010-10-27 DIAGNOSIS — K429 Umbilical hernia without obstruction or gangrene: Secondary | ICD-10-CM | POA: Insufficient documentation

## 2010-10-27 DIAGNOSIS — Z0000 Encounter for general adult medical examination without abnormal findings: Secondary | ICD-10-CM

## 2010-10-27 HISTORY — DX: Unspecified asthma, uncomplicated: J45.909

## 2010-10-27 HISTORY — DX: Umbilical hernia without obstruction or gangrene: K42.9

## 2010-10-27 LAB — LIPID PANEL
HDL: 32.4 mg/dL — ABNORMAL LOW (ref >39.00)
Total CHOL/HDL Ratio: 4
Triglycerides: 242 mg/dL — ABNORMAL HIGH (ref 0.0–149.0)
VLDL: 48.4 mg/dL — ABNORMAL HIGH (ref 0.0–40.0)

## 2010-10-27 LAB — HEPATIC FUNCTION PANEL
Alkaline Phosphatase: 70 U/L (ref 39–117)
Bilirubin, Direct: 0.1 mg/dL (ref 0.0–0.3)
Total Bilirubin: 0.4 mg/dL (ref 0.3–1.2)

## 2010-10-27 LAB — CBC WITH DIFFERENTIAL/PLATELET
Basophils Absolute: 0 10*3/uL (ref 0.0–0.1)
Eosinophils Absolute: 0.1 10*3/uL (ref 0.0–0.7)
Lymphocytes Relative: 33.8 % (ref 12.0–46.0)
MCHC: 33.9 g/dL (ref 30.0–36.0)
MCV: 94.2 fl (ref 78.0–100.0)
Monocytes Absolute: 1 10*3/uL (ref 0.1–1.0)
Neutrophils Relative %: 53.9 % (ref 43.0–77.0)
Platelets: 250 10*3/uL (ref 150.0–400.0)

## 2010-10-27 LAB — BASIC METABOLIC PANEL
BUN: 17 mg/dL (ref 6–23)
CO2: 25 mEq/L (ref 19–32)
Calcium: 9.5 mg/dL (ref 8.4–10.5)
Chloride: 107 mEq/L (ref 96–112)
Creatinine, Ser: 1 mg/dL (ref 0.4–1.5)

## 2010-10-27 LAB — TSH: TSH: 1.54 u[IU]/mL (ref 0.35–5.50)

## 2010-10-27 LAB — HEMOGLOBIN A1C: Hgb A1c MFr Bld: 7.6 % — ABNORMAL HIGH (ref 4.6–6.5)

## 2010-10-30 LAB — URINALYSIS, ROUTINE W REFLEX MICROSCOPIC
Specific Gravity, Urine: 1.025 (ref 1.000–1.030)
Total Protein, Urine: NEGATIVE
Urine Glucose: NEGATIVE

## 2010-10-30 LAB — MICROALBUMIN / CREATININE URINE RATIO: Creatinine,U: 99.2 mg/dL

## 2010-11-02 NOTE — Assessment & Plan Note (Signed)
Summary: CPX- LB   Vital Signs:  Patient profile:   45 year old male Height:      71 inches Weight:      224.13 pounds BMI:     31.37 O2 Sat:      94 % on Room air Temp:     97.5 degrees F oral Pulse rate:   83 / minute BP sitting:   110 / 70  (left arm) Cuff size:   large  Vitals Entered By: Zella Ball Ewing CMA (AAMA) (October 27, 2010 1:13 PM)  O2 Flow:  Room air  CC: Labs, discuss medication/RE   CC:  Labs and discuss medication/RE.  History of Present Illness: here to f/u; overall doing ok, Pt denies CP, worsening sob, doe, wheezing, orthopnea, pnd, worsening LE edema, palps, dizziness or syncope  Pt denies new neuro symptoms such as headache, facial or extremity weakness  Pt denies polydipsia, polyuria, or low sugar symptoms such as shakiness improved with eating.  Overall good compliance with meds, trying to follow low chol, DM diet, wt stable, little excercise however  CBG's in lower 100's but does not often check.  No fever, wt loss, night sweats, loss of appetite or other constitutional symptoms  Overall good compliance with meds, and good tolerability.  Denies worsening depressive symptoms, suicidal ideation, or panic, though has ongoing stress worse recetnly since wife not working, and falling behind on bills.  Pt states good ability with ADL's, low fall risk, home safety reviewed and adequate, no significant change in hearing or vision, trying to follow lower chol diet, and occasionally active only with regular excercise.  does have an enlarging mass to the left upper ant chest wall, as well as enlarging umbilical hernia , niether with pain, and no abd pain, n/v, bowel change, blood,    Problems Prior to Update: 1)  Hernia, Umbilical  (ICD-553.1) 2)  Skin Lesion  (ICD-709.9) 3)  Asthma, Unspecified, Unspecified Status  (ICD-493.90) 4)  Skin Lesion  (ICD-709.9) 5)  Psa, Increased  (ICD-790.93) 6)  Special Screening Malig Neoplasms Other Sites  (ICD-V76.49) 7)  Preventive Health  Care  (ICD-V70.0) 8)  Pe  (ICD-415.19) 9)  Elevated Blood Pressure Without Diagnosis of Hypertension  (ICD-796.2) 10)  Anxiety  (ICD-300.00) 11)  Gerd  (ICD-530.81) 12)  Nephrolithiasis, Hx of  (ICD-V13.01) 13)  Proteinuria  (ICD-791.0) 14)  Family History Diabetes 1st Degree Relative  (ICD-V18.0) 15)  Hyperlipidemia  (ICD-272.4) 16)  Diabetes Mellitus, Type II  (ICD-250.00)  Medications Prior to Update: 1)  Simvastatin 40 Mg  Tabs (Simvastatin) .Marland Kitchen.. 1po Once Daily 2)  Metformin Hcl 500 Mg  Tabs (Metformin Hcl) .... 2 By Mouth in The Am, and 1 By Mouth in The Pm 3)  Onetouch Ultra Test   Strp (Glucose Blood) .... Use Asd Qid 4)  Lancets   Misc (Lancets) .... Use Asd Qid 5)  Aspir-Low 81 Mg Tbec (Aspirin) .Marland Kitchen.. 1 By Mouth Once Daily  Current Medications (verified): 1)  Simvastatin 40 Mg  Tabs (Simvastatin) .Marland Kitchen.. 1po Once Daily 2)  Metformin Hcl 500 Mg  Tabs (Metformin Hcl) .... 2 By Mouth in The Am, and 1 By Mouth in The Pm 3)  Onetouch Ultra Test   Strp (Glucose Blood) .... Use Asd Once Daily 4)  Lancets   Misc (Lancets) .... Use Asd Once Daily 5)  Aspir-Low 81 Mg Tbec (Aspirin) .Marland Kitchen.. 1 By Mouth Once Daily 6)  Bupropion Hcl 150 Mg Xr24h-Tab (Bupropion Hcl) .Marland Kitchen.. 1po Once Daily  For 1 Wk, Then 2 By Mouth Once Daily After That 7)  Proair Hfa 108 (90 Base) Mcg/act Aers (Albuterol Sulfate) .... 2 Puffs Four Times Per Day As Needed Wheezing/sob  Allergies (verified): No Known Drug Allergies  Past History:  Past Medical History: Last updated: 08/31/2008 Diabetes mellitus, type II Hyperlipidemia Nephrolithiasis, hx of GERD Anxiety ? 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         all otherwise negative per pt -    Physical Exam  General:  alert and overweight-appearing.   Head:  normocephalic and atraumatic.   Eyes:  vision grossly intact, pupils equal, and pupils round.   Ears:  R ear normal and L ear normal.   Nose:  no external deformity and no nasal discharge.   Mouth:  no gingival abnormalities and pharynx pink and moist.   Neck:  supple and no masses.   Lungs:  normal respiratory effort and normal breath sounds.   Heart:  normal rate and regular rhythm.   Abdomen:  soft, non-tender, and normal bowel sounds, has rather large but nontender umbilical hernia and easily reducible Genitalia:  Testes bilaterally descended without nodularity, tenderness or masses. No scrotal masses or lesions. No penis lesions or urethral discharge. Msk:  no joint tenderness and no joint swelling.   Extremities:  no edema, no erythema  Neurologic:  cranial nerves II-XII intact and strength normal in all extremities.   Skin:  color normal and no rashes., does have approx 2 cm subq mass to left upper ant chest wall , nontender, firm but not hard, somewhat mobile/not fixed Psych:  not depressed appearing and moderately anxious.     Impression & Recommendations:  Problem # 1:  Preventive Health Care (ICD-V70.0)  Overall doing well, age appropriate  education and counseling updated, referral for preventive services and immunizations addressed, dietary counseling and smoking status adressed , most recent labs reviewed I have personally reviewed and have noted 1.The patient's medical and social history 2.Their use of alcohol, tobacco or illicit drugs 3.Their current medications and supplements 4. Functional ability including ADL's, fall risk, home safety risk, hearing & visual impairment  5.Diet and physical activities 6.Evidence for depression or mood disorders The patients weight, height, BMI  have been recorded in the chart I have made referrals, counseling and provided education to the patient based review of the above   Orders: TLB-BMP (Basic Metabolic Panel-BMET) (80048-METABOL) TLB-CBC Platelet - w/Differential (85025-CBCD) TLB-Hepatic/Liver Function Pnl (80076-HEPATIC) TLB-Lipid Panel (80061-LIPID) TLB-TSH (Thyroid Stimulating Hormone) (84443-TSH) TLB-PSA (Prostate Specific Antigen) (84153-PSA) TLB-Udip ONLY (81003-UDIP)  Problem # 2:  DIABETES MELLITUS, TYPE II (ICD-250.00)  His updated medication list for this problem includes:    Metformin Hcl 500 Mg Tabs (Metformin hcl) .Marland KitchenMarland KitchenMarland KitchenMarland Kitchen 2  by mouth in the am, and 1 by mouth in the pm    Aspir-low 81 Mg Tbec (Aspirin) .Marland Kitchen... 1 by mouth once daily  Labs Reviewed: Creat: 1.0 (08/04/2010)    Reviewed HgBA1c results: 7.7 (08/04/2010)  7.4  stable overall by hx and exam, ok to continue meds/tx as is   Orders: TLB-A1C / Hgb A1C (Glycohemoglobin) (83036-A1C) TLB-Microalbumin/Creat Ratio, Urine (82043-MALB)  Problem # 3:  HYPERLIPIDEMIA (ICD-272.4)  His updated medication list for this problem includes:    Simvastatin 40 Mg Tabs (Simvastatin) .Marland Kitchen... 1po once daily  Labs Reviewed: SGOT: 20 (10/03/2009)   SGPT: 35 (10/03/2009)   HDL:32.50 (08/04/2010), 36.80 (10/03/2009)  LDL:54 (08/04/2010), 82 (10/03/2009)  Chol:120 (08/04/2010), 148 (10/03/2009)  Trig:169.0 (08/04/2010), 146.0  (10/03/2009) stable overall by hx and exam, ok to continue meds/tx as is   Problem # 4:  ASTHMA, UNSPECIFIED, UNSPECIFIED STATUS (ICD-493.90)  His updated medication list for this problem includes:    Proair Hfa 108 (90 Base) Mcg/act Aers (Albuterol sulfate) .Marland Kitchen... 2 puffs four times per day as needed wheezing/sob new diagnosis, mild symptoms, with wheezing today and cough - for CXR, but surprisingly miinimally symtpomatic - treat as above, f/u any worsening signs or symptoms   Orders: T-2 View CXR, Same Day (71020.5TC)  Pulmonary Functions Reviewed: O2 sat: 94 (10/27/2010)  Problem # 5:  SKIN LESION (ICD-709.9)  approx 2 cm subq lesion left anterior chest, increased in size  - for gen surgury to see , also for cxr as above  Orders: Surgical Referral (Surgery)  Problem # 6:  HERNIA, UMBILICAL (ICD-553.1)  asympt - but pt would like tx if has the lump seen as above - also for gen surgury eval  Orders: Surgical Referral (Surgery)  Complete Medication List: 1)  Simvastatin 40 Mg Tabs (Simvastatin) .Marland Kitchen.. 1po once daily 2)  Metformin Hcl 500 Mg Tabs (Metformin hcl) .... 2 by mouth in the am, and 1 by mouth in the pm 3)  Onetouch Ultra Test Strp (Glucose blood) .... Use asd once daily 4)  Lancets Misc (Lancets) .... Use asd once daily 5)  Aspir-low 81 Mg Tbec (Aspirin) .Marland Kitchen.. 1 by mouth once daily 6)  Bupropion Hcl 150 Mg Xr24h-tab (Bupropion hcl) .Marland Kitchen.. 1po once daily for 1 wk, then 2 by mouth once daily after that 7)  Proair Hfa 108 (90 Base) Mcg/act Aers (Albuterol sulfate) .... 2 puffs four times per day as needed wheezing/sob  Patient Instructions: 1)  Please take all new medications as prescribed - the wellbutrin, proair inhaler 2)  Continue all previous medications as before this visit  3)  Please go to the Lab in the basement for your blood and/or urine tests today 4)  Please go to Radiology in the basement level for your X-Ray today  5)  Please call the number on the [facility] Card  for results of your testing  6)  You will be contacted about the referral(s) to: general surgury for the lump and the hernia 7)  Please schedule a follow-up appointment in 6 months with: 8)  BMP prior to visit, ICD-9: 250.02 9)  Lipid Panel prior to visit, ICD-9: 10)  HbgA1C prior to visit, ICD-9: Prescriptions: PROAIR HFA 108 (90 BASE) MCG/ACT AERS (ALBUTEROL SULFATE) 2 puffs four times per day as needed wheezing/sob  #1 x 11   Entered and Authorized by:   Corwin Levins MD   Signed by:   Corwin Levins MD on 10/27/2010   Method used:   Print  then Give to Patient   RxID:   [id number] BUPROPION HCL 150 MG XR24H-TAB (BUPROPION HCL) 1po once daily for 1 wk, then 2 by mouth once daily after that  #60 x 5   Entered and Authorized by:   Corwin Levins MD   Signed by:   Corwin Levins MD on 10/27/2010   Method used:   Print then Give to Patient   RxID:   [id number] LANCETS   MISC (LANCETS) use asd once daily  #100 x 11   Entered and Authorized by:   Corwin Levins MD   Signed by:   Corwin Levins MD on 10/27/2010   Method used:   Print then Give to Patient   RxID:   [id number] [facility] ULTRA TEST   STRP (GLUCOSE BLOOD) use asd once daily  #100 x 11   Entered and Authorized by:   Corwin Levins MD   Signed by:   Corwin Levins MD on 10/27/2010   Method used:   Print then Give to Patient   RxID:   [id number] METFORMIN HCL 500 MG  TABS (METFORMIN HCL) 2 by mouth in the am, and 1 by mouth in the pm  #270 x 11   Entered and Authorized by:   Corwin Levins MD   Signed by:   Corwin Levins MD on 10/27/2010   Method used:   Print then Give to Patient   RxID:   [id number] SIMVASTATIN 40 MG  TABS (SIMVASTATIN) 1po once daily  #90 x 3   Entered and Authorized by:   Corwin Levins MD   Signed by:   Corwin Levins MD on 10/27/2010   Method used:   Print then Give to Patient   RxID:   [id number]    Orders Added: 1)  T-2 View CXR, Same Day [71020.5TC] 2)  Surgical Referral  [Surgery] 3)  TLB-A1C / Hgb A1C (Glycohemoglobin) [83036-A1C] 4)  TLB-Microalbumin/Creat Ratio, Urine [82043-MALB] 5)  TLB-BMP (Basic Metabolic Panel-BMET) [80048-METABOL] 6)  TLB-CBC Platelet - w/Differential [85025-CBCD] 7)  TLB-Hepatic/Liver Function Pnl [80076-HEPATIC] 8)  TLB-Lipid Panel [80061-LIPID] 9)  TLB-TSH (Thyroid Stimulating Hormone) [84443-TSH] 10)  TLB-PSA (Prostate Specific Antigen) [84153-PSA] 11)  TLB-Udip ONLY [81003-UDIP] 12)  Est. Patient 40-64 years [99396] 13)  Est. Patient Level IV [09323]

## 2011-04-29 ENCOUNTER — Encounter: Payer: Self-pay | Admitting: Internal Medicine

## 2011-04-29 DIAGNOSIS — Z0001 Encounter for general adult medical examination with abnormal findings: Secondary | ICD-10-CM | POA: Insufficient documentation

## 2011-05-04 ENCOUNTER — Ambulatory Visit: Payer: PRIVATE HEALTH INSURANCE | Admitting: Internal Medicine

## 2011-11-17 ENCOUNTER — Other Ambulatory Visit: Payer: Self-pay | Admitting: Internal Medicine

## 2011-11-23 ENCOUNTER — Other Ambulatory Visit: Payer: Self-pay | Admitting: Internal Medicine

## 2011-12-15 ENCOUNTER — Other Ambulatory Visit: Payer: Self-pay | Admitting: Internal Medicine

## 2012-02-07 ENCOUNTER — Other Ambulatory Visit: Payer: Self-pay | Admitting: Internal Medicine

## 2012-02-16 ENCOUNTER — Other Ambulatory Visit: Payer: Self-pay | Admitting: Internal Medicine

## 2012-03-07 ENCOUNTER — Ambulatory Visit (INDEPENDENT_AMBULATORY_CARE_PROVIDER_SITE_OTHER): Payer: BC Managed Care – PPO | Admitting: Internal Medicine

## 2012-03-07 VITALS — BP 130/90 | HR 84 | Temp 97.0°F | Ht 72.0 in | Wt 222.5 lb

## 2012-03-07 DIAGNOSIS — Z0000 Encounter for general adult medical examination without abnormal findings: Secondary | ICD-10-CM

## 2012-03-07 DIAGNOSIS — E119 Type 2 diabetes mellitus without complications: Secondary | ICD-10-CM

## 2012-03-07 DIAGNOSIS — J45909 Unspecified asthma, uncomplicated: Secondary | ICD-10-CM

## 2012-03-07 MED ORDER — METFORMIN HCL 500 MG PO TABS: 1000.0000 mg | ORAL_TABLET | Freq: Two times a day (BID) | ORAL | Status: DC

## 2012-03-07 MED ORDER — BECLOMETHASONE DIPROPIONATE 80 MCG/ACT IN AERS: 1.0000 | INHALATION_SPRAY | RESPIRATORY_TRACT | Status: DC | PRN

## 2012-03-07 MED ORDER — ALBUTEROL SULFATE HFA 108 (90 BASE) MCG/ACT IN AERS: 2.0000 | INHALATION_SPRAY | Freq: Four times a day (QID) | RESPIRATORY_TRACT | Status: DC

## 2012-03-07 MED ORDER — SIMVASTATIN 40 MG PO TABS: 40.0000 mg | ORAL_TABLET | Freq: Every day | ORAL | Status: DC

## 2012-03-07 NOTE — Assessment & Plan Note (Signed)

## 2012-03-07 NOTE — Progress Notes (Signed)
Subjective:    Patient ID: Bradley Henson, male    DOB: 02-05-1966, 46 y.o.   MRN: [id number]  HPI  Here for wellness and f/u;  Overall doing ok;  Pt denies CP, worsening SOB, DOE, but has had mild intermittent wheezing with mild sob worse at night; but no  orthopnea, PND, worsening LE edema, palpitations, dizziness or syncope.  Pt denies neurological change such as new Headache, facial or extremity weakness.  Pt denies polydipsia, polyuria, or low sugar symptoms. Pt states overall good compliance with treatment and medications, good tolerability, and trying to follow lower cholesterol diet.  Pt denies worsening depressive symptoms, suicidal ideation or panic. No fever, wt loss, night sweats, loss of appetite, or other constitutional symptoms.  Pt states good ability with ADL's, low fall risk, home safety reviewed and adequate, no significant changes in hearing or vision, and occasionally active with exercise.  Out of all meds for 2 mo.  Long distance trucker.  Asks for med refills. Past Medical History  Diagnosis Date  . ANXIETY 07/17/2007  . ASTHMA, UNSPECIFIED, UNSPECIFIED STATUS 10/27/2010  . DIABETES MELLITUS, TYPE II 07/17/2007  . ELEVATED BLOOD PRESSURE WITHOUT DIAGNOSIS OF HYPERTENSION 10/13/2007  . GERD 07/17/2007  . HERNIA, UMBILICAL 10/27/2010  . HYPERLIPIDEMIA 07/17/2007  . NEPHROLITHIASIS, HX OF 07/17/2007  . PE 06/02/2008  . Proteinuria 07/17/2007  . PSA, INCREASED 10/03/2009  . SKIN LESION 08/04/2010   Past Surgical History  Procedure Date  . Inguinal herniorrhapy   . S/p nasal surgury and palatoplasty     reports that he has been smoking.  He does not have any smokeless tobacco history on file. He reports that he does not drink alcohol or use illicit drugs. family history includes Diabetes in his other. No Known Allergies Current Outpatient Prescriptions on File Prior to Visit  Medication Sig Dispense Refill  . aspirin 81 MG tablet Take 81 mg by mouth daily.        Marland Kitchen glucose  blood (ONE TOUCH ULTRA TEST) test strip daily. Use as directed once daily       . Lancets MISC Use as directed once daily       . albuterol (PROVENTIL HFA;VENTOLIN HFA) 108 (90 BASE) MCG/ACT inhaler Inhale 2 puffs into the lungs 4 (four) times daily.  1 Inhaler  11  . beclomethasone (QVAR) 80 MCG/ACT inhaler Inhale 1 puff into the lungs as needed.  3 Inhaler  3  . metFORMIN (GLUCOPHAGE) 500 MG tablet Take 2 tablets (1,000 mg total) by mouth 2 (two) times daily with a meal.  360 tablet  3  . simvastatin (ZOCOR) 40 MG tablet Take 1 tablet (40 mg total) by mouth daily.  90 tablet  3   Review of Systems Review of Systems  Constitutional: Negative for diaphoresis, activity change, appetite change and unexpected weight change.  HENT: Negative for hearing loss, ear pain, facial swelling, mouth sores and neck stiffness.   Eyes: Negative for pain, redness and visual disturbance.  Respiratory: Negative for shortness of breath and wheezing.   Cardiovascular: Negative for chest pain and palpitations.  Gastrointestinal: Negative for diarrhea, blood in stool, abdominal distention and rectal pain.  Genitourinary: Negative for hematuria, flank pain and decreased urine volume.  Musculoskeletal: Negative for myalgias and joint swelling.  Skin: Negative for color change and wound.  Neurological: Negative for syncope and numbness.  Hematological: Negative for adenopathy.  Psychiatric/Behavioral: Negative for hallucinations, self-injury, decreased concentration and agitation.      Objective:  Physical Exam BP 130/90  Pulse 84  Temp 97 F (36.1 C) (Oral)  Ht 6' (1.829 m)  Wt 222 lb 8 oz (100.925 kg)  BMI 30.18 kg/m2  SpO2 97% Physical Exam  VS noted Constitutional: Pt is oriented to person, place, and time. Appears well-developed and well-nourished.  HENT:  Head: Normocephalic and atraumatic.  Right Ear: External ear normal.  Left Ear: External ear normal.  Nose: Nose normal.  Mouth/Throat:  Oropharynx is clear and moist.  Eyes: Conjunctivae and EOM are normal. Pupils are equal, round, and reactive to light.  Neck: Normal range of motion. Neck supple. No JVD present. No tracheal deviation present.  Cardiovascular: Normal rate, regular rhythm, normal heart sounds and intact distal pulses.   Pulmonary/Chest: Effort normal and breath sounds mild decreased with bilat wheezes.  Abdominal: Soft. Bowel sounds are normal. There is no tenderness.  Musculoskeletal: Normal range of motion. Exhibits no edema.  Lymphadenopathy:  Has no cervical adenopathy.  Neurological: Pt is alert and oriented to person, place, and time. Pt has normal reflexes. No cranial nerve deficit.  Skin: Skin is warm and dry. No rash noted.  Psychiatric:  Has  normal mood and affect. Behavior is normal.     Assessment & Plan:

## 2012-03-07 NOTE — Patient Instructions (Addendum)
Your EKG was ok today You are given the copies of your last blood work from 2012 Take all new medications as prescribed - the QVAR for wheezing is one puff twice per day Continue all other medications as before; your medications were all refilled today Please go to LAB in the Basement for the blood and/or urine tests to be done today You will be contacted by phone if any changes need to be made immediately.  Otherwise, you will receive a letter about your results with an explanation. Please return in 6 mo with Lab testing done 3-5 days before

## 2012-03-08 ENCOUNTER — Encounter: Payer: Self-pay | Admitting: Internal Medicine

## 2012-03-08 NOTE — Assessment & Plan Note (Signed)
Mild uncontrolled, to start qvar asd,  to f/u any worsening symptoms or concerns SpO2 Readings from Last 3 Encounters:  03/07/12 97%  10/27/10 94%  08/04/10 93%

## 2012-03-08 NOTE — Assessment & Plan Note (Signed)
stable overall by hx and exam, most recent data reviewed with pt, and pt to continue medical treatment as before, for med refills Lab Results  Component Value Date   HGBA1C 7.6* 10/27/2010

## 2012-03-14 ENCOUNTER — Other Ambulatory Visit: Payer: Self-pay

## 2012-03-14 MED ORDER — METFORMIN HCL 500 MG PO TABS: 1000.0000 mg | ORAL_TABLET | Freq: Two times a day (BID) | ORAL | Status: DC

## 2012-03-14 MED ORDER — BECLOMETHASONE DIPROPIONATE 80 MCG/ACT IN AERS: 1.0000 | INHALATION_SPRAY | RESPIRATORY_TRACT | Status: DC | PRN

## 2012-03-14 MED ORDER — ALBUTEROL SULFATE HFA 108 (90 BASE) MCG/ACT IN AERS: 2.0000 | INHALATION_SPRAY | Freq: Four times a day (QID) | RESPIRATORY_TRACT | Status: DC

## 2012-03-14 MED ORDER — SIMVASTATIN 40 MG PO TABS: 40.0000 mg | ORAL_TABLET | Freq: Every day | ORAL | Status: DC

## 2012-07-20 IMAGING — CR DG CHEST 2V
2 series · 2 of 2 positions shown · non-contrast
Comparison: None.

CLINICAL DATA: Cough and shortness of breath.

CHEST - 2 VIEW

[view not recorded (1 of 2)]
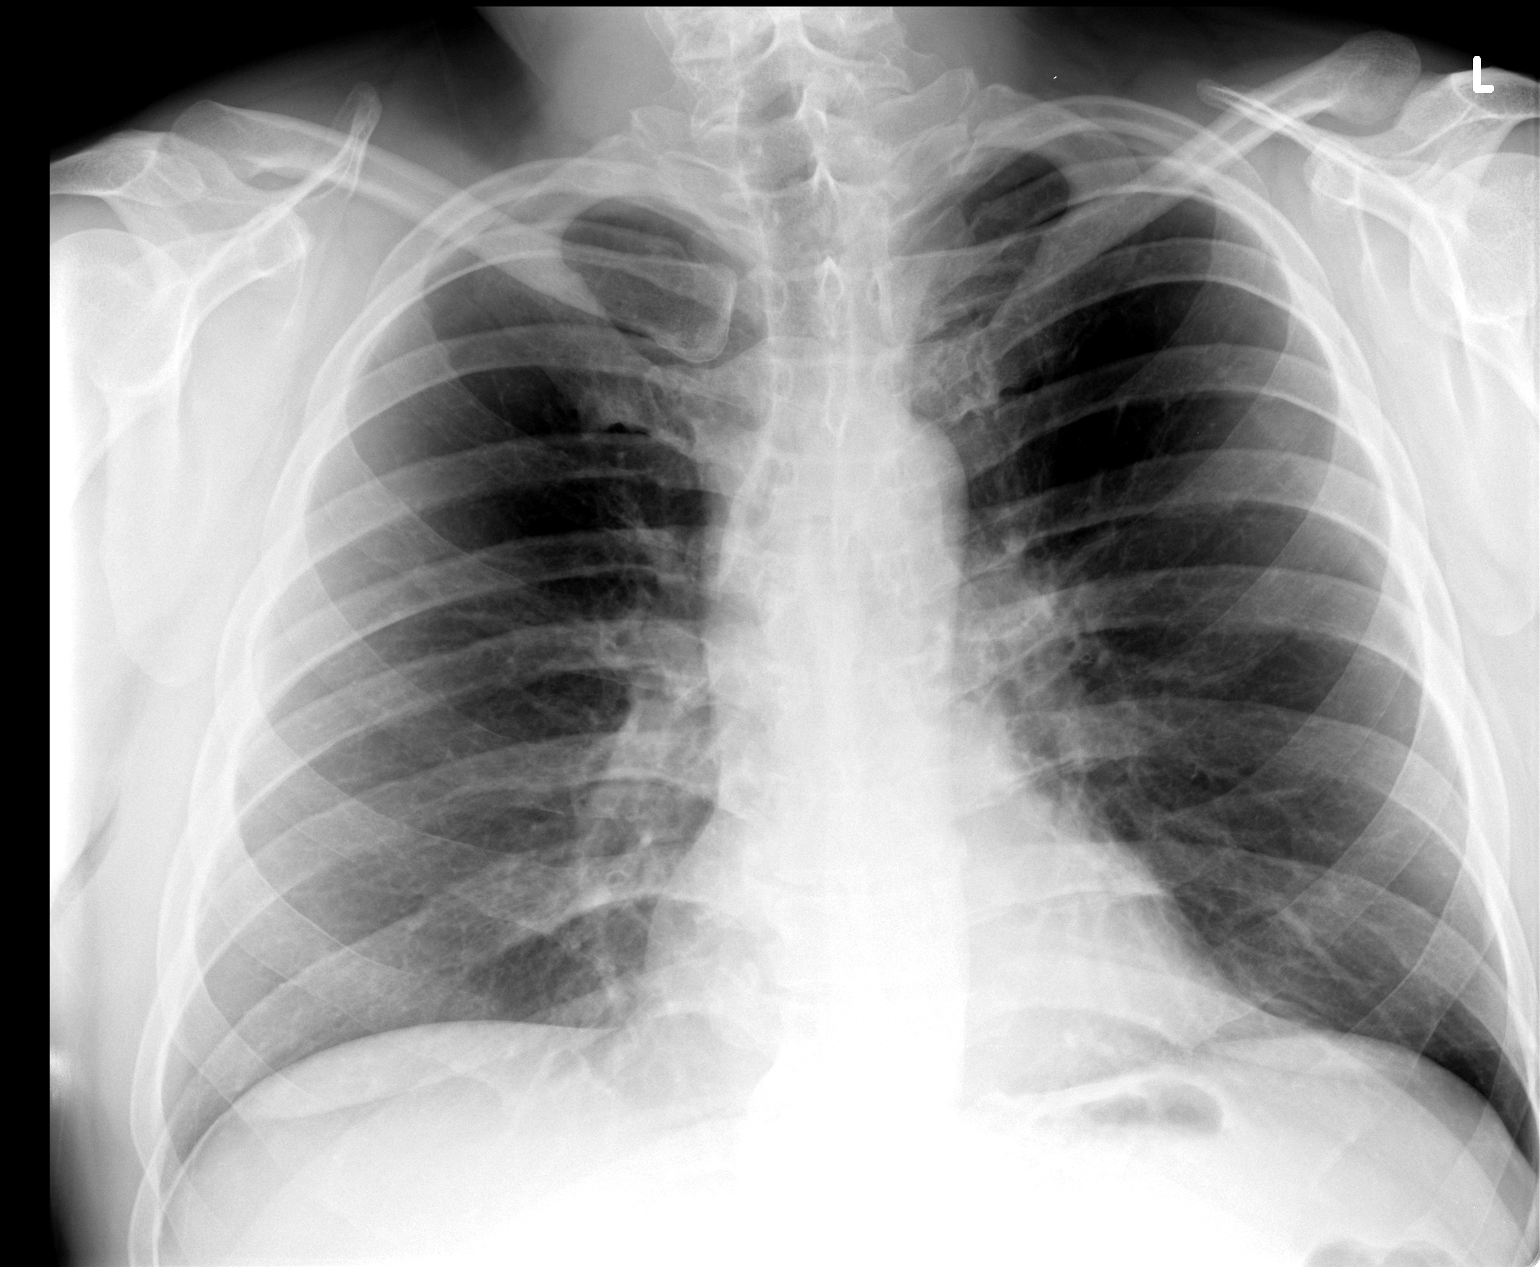

[view not recorded (2 of 2)]
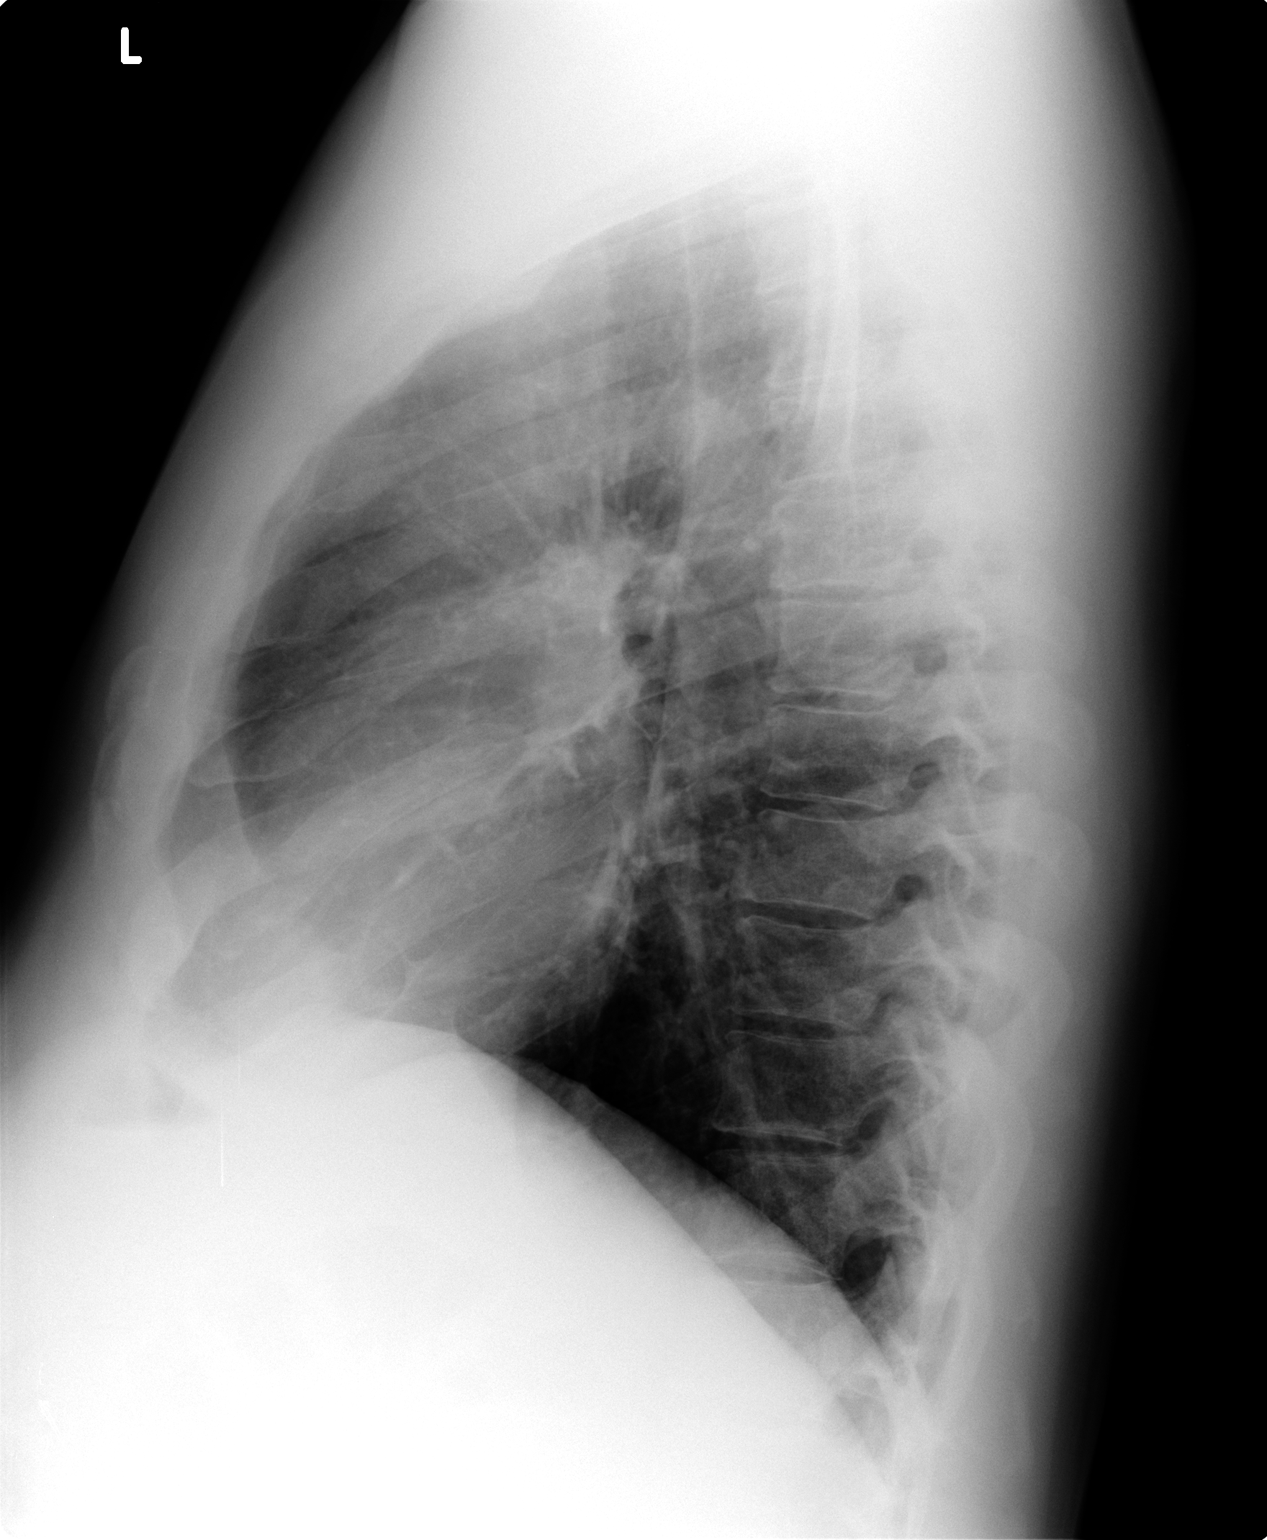

[2 of 2 positions shown; findings below may reference images not displayed]

FINDINGS: The lungs are clear without focal infiltrate, edema,
pneumothorax or pleural effusion. The cardiopericardial silhouette
is within normal limits for size. Imaged bony structures of the
thorax are intact.
IMPRESSION: Normal exam.

## 2012-07-30 ENCOUNTER — Encounter: Payer: Self-pay | Admitting: Internal Medicine

## 2012-07-30 ENCOUNTER — Other Ambulatory Visit: Payer: Self-pay | Admitting: Internal Medicine

## 2012-07-30 ENCOUNTER — Ambulatory Visit: Payer: Self-pay

## 2012-07-30 ENCOUNTER — Ambulatory Visit (INDEPENDENT_AMBULATORY_CARE_PROVIDER_SITE_OTHER): Payer: Self-pay | Admitting: Internal Medicine

## 2012-07-30 VITALS — BP 120/78 | HR 77 | Temp 97.0°F | Ht 73.0 in | Wt 228.0 lb

## 2012-07-30 DIAGNOSIS — E119 Type 2 diabetes mellitus without complications: Secondary | ICD-10-CM

## 2012-07-30 DIAGNOSIS — Z0000 Encounter for general adult medical examination without abnormal findings: Secondary | ICD-10-CM

## 2012-07-30 LAB — URINALYSIS, ROUTINE W REFLEX MICROSCOPIC
Bilirubin Urine: NEGATIVE
Ketones, ur: NEGATIVE
Leukocytes, UA: NEGATIVE
Nitrite: NEGATIVE

## 2012-07-30 LAB — BASIC METABOLIC PANEL
BUN: 16 mg/dL (ref 6–23)
Creatinine, Ser: 0.9 mg/dL (ref 0.4–1.5)
GFR: 99.29 mL/min (ref >60.00)
Glucose, Bld: 105 mg/dL — ABNORMAL HIGH (ref 70–99)

## 2012-07-30 MED ORDER — GLIPIZIDE ER 2.5 MG PO TB24: 2.5000 mg | ORAL_TABLET | Freq: Every day | ORAL | Status: DC

## 2012-07-30 NOTE — Patient Instructions (Addendum)
Continue all other medications as before Please have the pharmacy call with any other refills you may need. Your forms were filled out today Please let us know the fax number for your form when you are able Please go to LAB in the Basement for the blood and/or urine tests to be done today You will be contacted by phone if any changes need to be made immediately.  Otherwise, you will receive a letter about your results with an explanation, but please check with MyChart first. Thank you for enrolling in MyChart. Please follow the instructions below to securely access your online medical record. MyChart allows you to send messages to your doctor, view your test results, renew your prescriptions, schedule appointments, and more. To Log into MyChart, please go to [email or web address], and your Username is:  [email or web address] Please return in 6 mo with Lab testing done 3-5 days before

## 2012-07-30 NOTE — Progress Notes (Signed)
Subjective:    Patient ID: Bradley Henson, male    DOB: 06/02/1966, 46 y.o.   MRN: [id number]  HPI  Here to f/u; overall doing ok,  Pt denies chest pain, increased sob or doe, wheezing, orthopnea, PND, increased LE swelling, palpitations, dizziness or syncope.  Pt denies new neurological symptoms such as new headache, or facial or extremity weakness or numbness   Pt denies polydipsia, polyuria, or low sugar symptoms such as weakness or confusion improved with po intake.  Pt states overall good compliance with meds, trying to follow lower cholesterol, diabetic diet, wt overall stable but little exercise however.  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 Past Medical History  Diagnosis Date  . ANXIETY 07/17/2007  . ASTHMA, UNSPECIFIED, UNSPECIFIED STATUS 10/27/2010  . DIABETES MELLITUS, TYPE II 07/17/2007  . ELEVATED BLOOD PRESSURE WITHOUT DIAGNOSIS OF HYPERTENSION 10/13/2007  . GERD 07/17/2007  . HERNIA, UMBILICAL 10/27/2010  . HYPERLIPIDEMIA 07/17/2007  . NEPHROLITHIASIS, HX OF 07/17/2007  . PE 06/02/2008  . Proteinuria 07/17/2007  . PSA, INCREASED 10/03/2009  . SKIN LESION 08/04/2010   Past Surgical History  Procedure Date  . Inguinal herniorrhapy   . S/p nasal surgury and palatoplasty     reports that he has been smoking.  He has never used smokeless tobacco. He reports that he does not drink alcohol or use illicit drugs. family history includes Diabetes in his other. No Known Allergies Current Outpatient Prescriptions on File Prior to Visit  Medication Sig Dispense Refill  . albuterol (PROVENTIL HFA;VENTOLIN HFA) 108 (90 BASE) MCG/ACT inhaler Inhale 2 puffs into the lungs 4 (four) times daily.  1 Inhaler  11  . aspirin 81 MG tablet Take 81 mg by mouth daily.        . beclomethasone (QVAR) 80 MCG/ACT inhaler Inhale 1 puff into the lungs as needed.  3 Inhaler  3  . metFORMIN (GLUCOPHAGE) 500 MG tablet Take 2 tablets (1,000 mg total) by mouth 2 (two) times daily with a meal.  360 tablet  3   . glipiZIDE (GLIPIZIDE XL) 2.5 MG 24 hr tablet Take 1 tablet (2.5 mg total) by mouth daily.  90 tablet  3  . glucose blood (ONE TOUCH ULTRA TEST) test strip daily. Use as directed once daily       . Lancets MISC Use as directed once daily       . simvastatin (ZOCOR) 40 MG tablet Take 1 tablet (40 mg total) by mouth daily.  90 tablet  3   Review of Systems  Constitutional: Negative for diaphoresis and unexpected weight change.  HENT: Negative for tinnitus.   Eyes: Negative for photophobia and visual disturbance.  Respiratory: Negative for choking and stridor.   Gastrointestinal: Negative for vomiting and blood in stool.  Genitourinary: Negative for hematuria and decreased urine volume.  Musculoskeletal: Negative for gait problem.  Skin: Negative for color change and wound.  Neurological: Negative for tremors and numbness.  Psychiatric/Behavioral: Negative for decreased concentration. The patient is not hyperactive.       Objective:   Physical Exam BP 120/78  Pulse 77  Temp 97 F (36.1 C) (Oral)  Ht 6\' 1"  (1.854 m)  Wt 228 lb (103.42 kg)  BMI 30.08 kg/m2  SpO2 98% Physical Exam  VS noted Constitutional: Pt appears well-developed and well-nourished.  HENT: Head: Normocephalic.  Right Ear: External ear normal.  Left Ear: External ear normal.  Eyes: Conjunctivae and EOM are normal. Pupils are equal, round, and reactive to  light.  Neck: Normal range of motion. Neck supple.  Cardiovascular: Normal rate and regular rhythm.   Pulmonary/Chest: Effort normal and breath sounds normal.  Abd:  Soft, NT, non-distended, + BS Neurological: Pt is alert. Not confused  Skin: Skin is warm. No erythema.  Psychiatric: Pt behavior is normal. Thought content normal.         Assessment & Plan:

## 2012-07-30 NOTE — Assessment & Plan Note (Signed)
stable overall by hx and exam, most recent data reviewed with pt, and pt to continue medical treatment as before, for labs today, forms filled out for DOT purpose

## 2012-08-08 ENCOUNTER — Encounter: Payer: BC Managed Care – PPO | Admitting: Internal Medicine

## 2012-10-11 ENCOUNTER — Other Ambulatory Visit: Payer: Self-pay

## 2013-06-05 ENCOUNTER — Other Ambulatory Visit: Payer: Self-pay | Admitting: Internal Medicine

## 2013-07-02 ENCOUNTER — Other Ambulatory Visit: Payer: Self-pay

## 2014-06-25 ENCOUNTER — Other Ambulatory Visit: Payer: Self-pay | Admitting: Internal Medicine

## 2016-03-20 NOTE — Progress Notes (Signed)
Opened in error

## 2017-03-28 ENCOUNTER — Telehealth: Payer: Self-pay | Admitting: Internal Medicine

## 2017-03-28 NOTE — Telephone Encounter (Signed)
Pt was last seen in 2013. He called wanting to make an appointment with you for a physical. Would you be willing to re-establish care with him?

## 2017-03-28 NOTE — Telephone Encounter (Signed)
LM for pt to call back to schedule °

## 2017-03-28 NOTE — Telephone Encounter (Signed)
Ok with me 

## 2017-03-28 NOTE — Telephone Encounter (Signed)
Pls call pt and make CPX.Marland KitchenJohny Henson

## 2017-04-04 ENCOUNTER — Encounter: Payer: Self-pay | Admitting: Internal Medicine

## 2017-04-05 ENCOUNTER — Telehealth: Payer: Self-pay

## 2017-04-05 ENCOUNTER — Ambulatory Visit (INDEPENDENT_AMBULATORY_CARE_PROVIDER_SITE_OTHER): Payer: BLUE CROSS/BLUE SHIELD | Admitting: Internal Medicine

## 2017-04-05 ENCOUNTER — Encounter: Payer: Self-pay | Admitting: Internal Medicine

## 2017-04-05 ENCOUNTER — Other Ambulatory Visit (INDEPENDENT_AMBULATORY_CARE_PROVIDER_SITE_OTHER): Payer: BLUE CROSS/BLUE SHIELD

## 2017-04-05 ENCOUNTER — Other Ambulatory Visit: Payer: Self-pay | Admitting: Internal Medicine

## 2017-04-05 VITALS — BP 194/120 | HR 81 | Ht 73.0 in | Wt 207.0 lb

## 2017-04-05 DIAGNOSIS — I10 Essential (primary) hypertension: Secondary | ICD-10-CM | POA: Insufficient documentation

## 2017-04-05 DIAGNOSIS — D179 Benign lipomatous neoplasm, unspecified: Secondary | ICD-10-CM | POA: Diagnosis not present

## 2017-04-05 DIAGNOSIS — E119 Type 2 diabetes mellitus without complications: Secondary | ICD-10-CM

## 2017-04-05 DIAGNOSIS — E785 Hyperlipidemia, unspecified: Secondary | ICD-10-CM

## 2017-04-05 DIAGNOSIS — Z0001 Encounter for general adult medical examination with abnormal findings: Secondary | ICD-10-CM

## 2017-04-05 DIAGNOSIS — K429 Umbilical hernia without obstruction or gangrene: Secondary | ICD-10-CM | POA: Diagnosis not present

## 2017-04-05 HISTORY — DX: Umbilical hernia without obstruction or gangrene: K42.9

## 2017-04-05 LAB — CBC WITH DIFFERENTIAL/PLATELET
BASOS ABS: 0.1 10*3/uL (ref 0.0–0.1)
Basophils Relative: 0.6 % (ref 0.0–3.0)
EOS PCT: 0.5 % (ref 0.0–5.0)
Eosinophils Absolute: 0 10*3/uL (ref 0.0–0.7)
HEMATOCRIT: 48.4 % (ref 39.0–52.0)
HEMOGLOBIN: 16.4 g/dL (ref 13.0–17.0)
LYMPHS ABS: 2.3 10*3/uL (ref 0.7–4.0)
LYMPHS PCT: 25.9 % (ref 12.0–46.0)
MCHC: 33.9 g/dL (ref 30.0–36.0)
MCV: 92.4 fl (ref 78.0–100.0)
MONOS PCT: 9.7 % (ref 3.0–12.0)
Monocytes Absolute: 0.9 10*3/uL (ref 0.1–1.0)
Neutro Abs: 5.6 10*3/uL (ref 1.4–7.7)
Neutrophils Relative %: 63.3 % (ref 43.0–77.0)
Platelets: 287 10*3/uL (ref 150.0–400.0)
RBC: 5.24 Mil/uL (ref 4.22–5.81)
RDW: 13 % (ref 11.5–15.5)
WBC: 8.9 10*3/uL (ref 4.0–10.5)

## 2017-04-05 LAB — URINALYSIS, ROUTINE W REFLEX MICROSCOPIC
BILIRUBIN URINE: NEGATIVE
LEUKOCYTES UA: NEGATIVE
NITRITE: NEGATIVE
Specific Gravity, Urine: >=1.03 — AB (ref 1.000–1.030)
Total Protein, Urine: NEGATIVE
UROBILINOGEN UA: 0.2 (ref 0.0–1.0)
Urine Glucose: >=1000 — AB
pH: 6 (ref 5.0–8.0)

## 2017-04-05 LAB — HEPATIC FUNCTION PANEL
ALBUMIN: 4.8 g/dL (ref 3.5–5.2)
ALT: 25 U/L (ref 0–53)
AST: 15 U/L (ref 0–37)
Alkaline Phosphatase: 104 U/L (ref 39–117)
BILIRUBIN DIRECT: 0.1 mg/dL (ref 0.0–0.3)
BILIRUBIN TOTAL: 0.5 mg/dL (ref 0.2–1.2)
Total Protein: 7.4 g/dL (ref 6.0–8.3)

## 2017-04-05 LAB — HEMOGLOBIN A1C: Hgb A1c MFr Bld: 10.6 % — ABNORMAL HIGH (ref 4.6–6.5)

## 2017-04-05 LAB — LIPID PANEL
CHOL/HDL RATIO: 4
Cholesterol: 189 mg/dL (ref 0–200)
HDL: 43.3 mg/dL (ref >39.00)
LDL Cholesterol: 115 mg/dL — ABNORMAL HIGH (ref 0–99)
NONHDL: 145.33
TRIGLYCERIDES: 151 mg/dL — AB (ref 0.0–149.0)
VLDL: 30.2 mg/dL (ref 0.0–40.0)

## 2017-04-05 LAB — BASIC METABOLIC PANEL
BUN: 12 mg/dL (ref 6–23)
CALCIUM: 9.8 mg/dL (ref 8.4–10.5)
CHLORIDE: 99 meq/L (ref 96–112)
CO2: 30 meq/L (ref 19–32)
Creatinine, Ser: 0.86 mg/dL (ref 0.40–1.50)
GFR: 99.95 mL/min (ref >60.00)
Glucose, Bld: 229 mg/dL — ABNORMAL HIGH (ref 70–99)
Potassium: 4.3 mEq/L (ref 3.5–5.1)
Sodium: 137 mEq/L (ref 135–145)

## 2017-04-05 LAB — MICROALBUMIN / CREATININE URINE RATIO
Creatinine,U: 125.4 mg/dL
Microalb Creat Ratio: 3.3 mg/g (ref 0.0–30.0)
Microalb, Ur: 4.2 mg/dL — ABNORMAL HIGH (ref 0.0–1.9)

## 2017-04-05 LAB — PSA: PSA: 1.08 ng/mL (ref 0.10–4.00)

## 2017-04-05 LAB — TSH: TSH: 1.64 u[IU]/mL (ref 0.35–4.50)

## 2017-04-05 MED ORDER — METFORMIN HCL 1000 MG PO TABS: 1000.0000 mg | ORAL_TABLET | Freq: Two times a day (BID) | ORAL | 3 refills | Status: DC

## 2017-04-05 MED ORDER — IRBESARTAN 150 MG PO TABS: 150.0000 mg | ORAL_TABLET | Freq: Every day | ORAL | 3 refills | Status: DC

## 2017-04-05 MED ORDER — ROSUVASTATIN CALCIUM 20 MG PO TABS: 20.0000 mg | ORAL_TABLET | Freq: Every day | ORAL | 3 refills | Status: DC

## 2017-04-05 MED ORDER — GLIPIZIDE ER 5 MG PO TB24: 5.0000 mg | ORAL_TABLET | Freq: Every day | ORAL | 3 refills | Status: DC

## 2017-04-05 NOTE — Progress Notes (Signed)
Subjective:    Patient ID: Bradley Henson, male    DOB: 1966/02/11, 51 y.o.   MRN: [id number]  HPI  Here for wellness and f/u;  Overall doing ok;  Pt denies Chest pain, worsening SOB, DOE, wheezing, orthopnea, PND, worsening LE edema, palpitations, dizziness or syncope.  Pt denies neurological change such as new headache, facial or extremity weakness.  Pt denies polydipsia, polyuria, or low sugar symptoms. Pt states overall good compliance with treatment and medications, good tolerability, and has been trying to follow appropriate diet.  Pt denies worsening depressive symptoms, suicidal ideation or panic. No fever, night sweats, wt loss, loss of appetite, or other constitutional symptoms.  Pt states good ability with ADL's, has low fall risk, home safety reviewed and adequate, no other significant changes in hearing or vision, and only occasionally active with exercise.  Has lost wt recently with better diet Wt Readings from Last 3 Encounters:  04/05/17 207 lb (93.9 kg)  07/30/12 228 lb (103.4 kg)  03/07/12 222 lb 8 oz (100.9 kg)  Stopped smoking last wk. BP at [place] several times at 150-160's. 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   Has overall lost 50 lbs from several yrs ago per pt, no longer on metformin.  Has better insurance and asks for re-referral to general surgury for left upper lipoma, as is getting larger.  No longer with significant asthma with wt loss.  Willing to restart the statin.  Has worsening umbilical hernia, no pain, but would like repair. Past Medical History:  Diagnosis Date  . ANXIETY 07/17/2007  . ASTHMA, UNSPECIFIED, UNSPECIFIED STATUS 10/27/2010  . DIABETES MELLITUS, TYPE II 07/17/2007  . ELEVATED BLOOD PRESSURE WITHOUT DIAGNOSIS OF HYPERTENSION 10/13/2007  . GERD 07/17/2007  . HERNIA, UMBILICAL 04/29/7341  . HYPERLIPIDEMIA 07/17/2007  . NEPHROLITHIASIS, HX OF 07/17/2007  . PE 06/02/2008  . Proteinuria 07/17/2007  . PSA, INCREASED 10/03/2009  . SKIN LESION 08/04/2010   Past  Surgical History:  Procedure Laterality Date  . inguinal herniorrhapy    . s/p nasal surgury and palatoplasty      reports that he has been smoking.  He has never used smokeless tobacco. He reports that he does not drink alcohol or use drugs. family history includes Diabetes in his other. No Known Allergies Current Outpatient Prescriptions on File Prior to Visit  Medication Sig Dispense Refill  . aspirin 81 MG tablet Take 81 mg by mouth daily.       No current facility-administered medications on file prior to visit.    Review of Systems Constitutional: Negative for other unusual diaphoresis, sweats, appetite or weight changes HENT: Negative for other worsening hearing loss, ear pain, facial swelling, mouth sores or neck stiffness.   Eyes: Negative for other worsening pain, redness or other visual disturbance.  Respiratory: Negative for other stridor or swelling Cardiovascular: Negative for other palpitations or other chest pain  Gastrointestinal: Negative for worsening diarrhea or loose stools, blood in stool, distention or other pain Genitourinary: Negative for hematuria, flank pain or other change in urine volume.  Musculoskeletal: Negative for myalgias or other joint swelling.  Skin: Negative for other color change, or other wound or worsening drainage.  Neurological: Negative for other syncope or numbness. Hematological: Negative for other adenopathy or swelling Psychiatric/Behavioral: Negative for hallucinations, other worsening agitation, SI, self-injury, or new decreased concentration All other system neg per pt    Objective:   Physical Exam BP (!) 194/120   Pulse 81   Ht 6\' 1"  (1.854  m)   Wt 207 lb (93.9 kg)   SpO2 99%   BMI 27.31 kg/m  VS noted,  Constitutional: Pt is oriented to person, place, and time. Appears well-developed and well-nourished, in no significant distress and comfortable Head: Normocephalic and atraumatic  Eyes: Conjunctivae and EOM are normal.  Pupils are equal, round, and reactive to light Right Ear: External ear normal without discharge Left Ear: External ear normal without discharge Nose: Nose without discharge or deformity Mouth/Throat: Oropharynx is without other ulcerations and moist  Neck: Normal range of motion. Neck supple. No JVD present. No tracheal deviation present or significant neck LA or mass Cardiovascular: Normal rate, regular rhythm, normal heart sounds and intact distal pulses.   Pulmonary/Chest: WOB normal and breath sounds without rales or wheezing  Abdominal: Soft. Bowel sounds are normal. NT. No HSM , has mod large nontender reducible hernia Musculoskeletal: Normal range of motion. Exhibits no edema Lymphadenopathy: Has no other cervical adenopathy.  Neurological: Pt is alert and oriented to person, place, and time. Pt has normal reflexes. No cranial nerve deficit. Motor grossly intact, Gait intact Skin: Skin is warm and dry. No rash noted or new ulcerations, Has > 6 cm lipoma like mass to left mid back near scapula Psychiatric:  Has normal mood and affect. Behavior is normal without agitation No other exam findings Lab Results  Component Value Date   WBC 8.9 04/05/2017   HGB 16.4 04/05/2017   HCT 48.4 04/05/2017   PLT 287.0 04/05/2017   GLUCOSE 229 (H) 04/05/2017   CHOL 189 04/05/2017   TRIG 151.0 (H) 04/05/2017   HDL 43.30 04/05/2017   LDLDIRECT 58.3 10/27/2010   LDLCALC 115 (H) 04/05/2017   ALT 25 04/05/2017   AST 15 04/05/2017   NA 137 04/05/2017   K 4.3 04/05/2017   CL 99 04/05/2017   CREATININE 0.86 04/05/2017   BUN 12 04/05/2017   CO2 30 04/05/2017   TSH 1.64 04/05/2017   PSA 1.08 04/05/2017   INR 2.6 02/11/2009   HGBA1C 10.6 (H) 04/05/2017   MICROALBUR 4.2 (H) 04/05/2017       Assessment & Plan:

## 2017-04-05 NOTE — Telephone Encounter (Signed)
-----   Message from Biagio Borg, MD sent at 04/05/2017  1:09 PM EDT ----- Left message on MyChart, pt to cont same tx except  The test results show that your current treatment is OK, except the LDL cholesterol is mild to moderately elevated, and the Sugar is Severely high.  As we discussed at your visit, we will need to restart medication for Diabetes, as well as a low dose statin called crestor for elevated cholesterol.    Bradley Henson to please inform pt, I will do rx x 2 for DM, and 1 for statin, I will also refer to DM education as this would be quite helpful

## 2017-04-05 NOTE — Telephone Encounter (Signed)
Patient called back. He was informed and understood.

## 2017-04-05 NOTE — Telephone Encounter (Signed)
Spoke with pt, however at the moment he did not have time to discuss. He stated that he would call back.

## 2017-04-05 NOTE — Patient Instructions (Addendum)
Please take all new medication as prescribed - the avapro for Blood Pressure, and crestor for cholesterol  Please continue all other medications as before, including the Aspirin 81 mg every day  Please have the pharmacy call with any other refills you may need.  Please continue your efforts at being more active, low cholesterol diet, and weight control.  You are otherwise up to date with prevention measures today.  Please keep your appointments with your specialists as you may have planned  You will be contacted regarding the referral for: colonoscopy, and general surgury for the lipoma  Please go to the LAB in the Basement (turn left off the elevator) for the tests to be done today  You will be contacted by phone if any changes need to be made immediately.  Otherwise, you will receive a letter about your results with an explanation, but please check with MyChart first.  Please remember to sign up for MyChart if you have not done so, as this will be important to you in the future with finding out test results, communicating by private email, and scheduling acute appointments online when needed.  Please return in 6 months, or sooner if needed, with Lab testing done 3-5 days before

## 2017-04-08 NOTE — Assessment & Plan Note (Signed)

## 2017-04-08 NOTE — Assessment & Plan Note (Signed)
Uncontrolled, to start avapro asd, f/u BP at home and next visit,  to f/u any worsening symptoms or concerns

## 2017-04-08 NOTE — Assessment & Plan Note (Signed)
Not incarcerated by exam, for general surgury referral

## 2017-04-08 NOTE — Assessment & Plan Note (Signed)
Mild elevated. For crestor asd,  to f/u any worsening symptoms or concerns

## 2017-04-08 NOTE — Assessment & Plan Note (Signed)
Getting larger, also for general surgury referrral

## 2017-04-08 NOTE — Assessment & Plan Note (Addendum)
?   Control, to cont med tx, diet, wt control, and f/u A1c  In addition to the time spent performing CPE, I spent an additional 25 minutes face to face,in which greater than 50% of this time was spent in counseling and coordination of care for patient's acute illness as documented, including the differential diagnosis, further evaluation, tx and further management of DM, HTN, HLD, lipoma and umbilcal hernia

## 2017-05-17 ENCOUNTER — Ambulatory Visit (INDEPENDENT_AMBULATORY_CARE_PROVIDER_SITE_OTHER): Payer: BLUE CROSS/BLUE SHIELD | Admitting: Internal Medicine

## 2017-05-17 ENCOUNTER — Encounter: Payer: Self-pay | Admitting: Internal Medicine

## 2017-05-17 VITALS — BP 182/106 | HR 83 | Temp 97.6°F | Ht 73.0 in | Wt 204.0 lb

## 2017-05-17 DIAGNOSIS — E119 Type 2 diabetes mellitus without complications: Secondary | ICD-10-CM

## 2017-05-17 DIAGNOSIS — E785 Hyperlipidemia, unspecified: Secondary | ICD-10-CM | POA: Diagnosis not present

## 2017-05-17 DIAGNOSIS — I10 Essential (primary) hypertension: Secondary | ICD-10-CM

## 2017-05-17 LAB — POCT GLYCOSYLATED HEMOGLOBIN (HGB A1C): Hemoglobin A1C: 8.7

## 2017-05-17 MED ORDER — IRBESARTAN 300 MG PO TABS: 300.0000 mg | ORAL_TABLET | Freq: Every day | ORAL | 3 refills | Status: DC

## 2017-05-17 MED ORDER — AMLODIPINE BESYLATE 5 MG PO TABS: 5.0000 mg | ORAL_TABLET | Freq: Every day | ORAL | 3 refills | Status: DC

## 2017-05-17 MED ORDER — DULAGLUTIDE 1.5 MG/0.5ML ~~LOC~~ SOAJ: SUBCUTANEOUS | 3 refills | Status: DC

## 2017-05-17 NOTE — Progress Notes (Signed)
Subjective:    Patient ID: Bradley Henson, male    DOB: 17-Jun-1966, 51 y.o.   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 ,  Pt denies chest pain, increasing sob or doe, wheezing, orthopnea, PND, increased LE swelling, palpitations, dizziness or syncope.  Pt denies new neurological symptoms such as new headache, or facial or extremity weakness or numbness.  Pt denies polydipsia, polyuria, or low sugar episode.  Pt states overall good compliance with meds, mostly trying to follow appropriate diet better than before, with wt down several lbs,  but little exercise however. Drives long distance, often away for 2 wks, sometimes 1 month.  Does c/o ongoing fatigue, but denies signficant daytime hypersomnolence. Wt Readings from Last 3 Encounters:  05/17/17 204 lb (92.5 kg)  04/05/17 207 lb (93.9 kg)  07/30/12 228 lb (103.4 kg)   BP Readings from Last 3 Encounters:  05/17/17 (!) 182/106  04/05/17 (!) 194/120  07/30/12 120/78   Past Medical History:  Diagnosis Date  . ANXIETY 07/17/2007  . ASTHMA, UNSPECIFIED, UNSPECIFIED STATUS 10/27/2010  . DIABETES MELLITUS, TYPE II 07/17/2007  . ELEVATED BLOOD PRESSURE WITHOUT DIAGNOSIS OF HYPERTENSION 10/13/2007  . GERD 07/17/2007  . HERNIA, UMBILICAL 0/08/6008  . HYPERLIPIDEMIA 07/17/2007  . NEPHROLITHIASIS, HX OF 07/17/2007  . PE 06/02/2008  . Proteinuria 07/17/2007  . PSA, INCREASED 10/03/2009  . SKIN LESION 08/04/2010   Past Surgical History:  Procedure Laterality Date  . inguinal herniorrhapy    . s/p nasal surgury and palatoplasty      reports that he has been smoking.  He has never used smokeless tobacco. He reports that he does not drink alcohol or use drugs. family history includes Diabetes in his other. No Known Allergies Current Outpatient Prescriptions on File Prior to Visit  Medication Sig Dispense Refill  . aspirin 81 MG tablet Take 81 mg by mouth  daily.      Marland Kitchen glipiZIDE (GLUCOTROL XL) 5 MG 24 hr tablet Take 1 tablet (5 mg total) by mouth daily with breakfast. 90 tablet 3  . metFORMIN (GLUCOPHAGE) 1000 MG tablet Take 1 tablet (1,000 mg total) by mouth 2 (two) times daily with a meal. 180 tablet 3  . rosuvastatin (CRESTOR) 20 MG tablet Take 1 tablet (20 mg total) by mouth daily. 90 tablet 3   No current facility-administered medications on file prior to visit.    Review of Systems  Constitutional: Negative for other unusual diaphoresis or sweats HENT: Negative for ear discharge or swelling Eyes: Negative for other worsening visual disturbances Respiratory: Negative for stridor or other swelling  Gastrointestinal: Negative for worsening distension or other blood Genitourinary: Negative for retention or other urinary change Musculoskeletal: Negative for other MSK pain or swelling Skin: Negative for color change or other new lesions Neurological: Negative for worsening tremors and other numbness  Psychiatric/Behavioral: Negative for worsening agitation or other fatigue All other system neg per pt    Objective:   Physical Exam BP (!) 182/106   Pulse 83   Temp 97.6 F (36.4 C) (Oral)   Ht 6\' 1"  (1.854 m)   Wt 204 lb (92.5 kg)   SpO2 98%   BMI 26.91 kg/m  VS noted,  Constitutional: Pt appears in NAD HENT: Head: NCAT.  Right Ear: External ear normal.  Left Ear: External ear normal.  Eyes: . Pupils are equal, round, and reactive to  light. Conjunctivae and EOM are normal Nose: without d/c or deformity Neck: Neck supple. Gross normal ROM Cardiovascular: Normal rate and regular rhythm.   Pulmonary/Chest: Effort normal and breath sounds without rales or wheezing.  Abd:  Soft, NT, ND, + BS, no organomegaly Neurological: Pt is alert. At baseline orientation, motor grossly intact Skin: Skin is warm. No rashes, other new lesions, no LE edema Psychiatric: Pt behavior is normal without agitation  No other exam findings  POCT - a1c  - 8.7    Assessment & Plan:

## 2017-05-17 NOTE — Assessment & Plan Note (Signed)
Severe uncontrolled, somewhat situational thinking he may lose his job if he has to start insulin, but still elevated significantlly - for increased avapro 300 qd, and add amlod 5 qd, also f/u oct 2 with surgury appt planned, also f/u with Nurse Visit prior to his DOT cpx in later october

## 2017-05-17 NOTE — Assessment & Plan Note (Signed)
Improved with better diet but still elevated a1c today, to add trulicity 1.5 weekly, cont all other meds, diet, wt control efforts

## 2017-05-17 NOTE — Assessment & Plan Note (Signed)
stable overall by history and exam, recent data reviewed with pt, and pt to continue medical treatment as before,  to f/u any worsening symptoms or concerns Lab Results  Component Value Date   LDLCALC 115 (H) 04/05/2017  to cont the new crestor, low chol diet, f/u next visit

## 2017-05-17 NOTE — Patient Instructions (Addendum)
Please take all new medication as prescribed - the Trulicity for the sugar  Please have the pharmacist call us if the copay is too expensive, with the name of a similar medication that should be covered  OK to increase the avapro to 300 mg per day  Please take all new medication as prescribed - the amlodipine 5 mg per day (for Blood Pressure)  Please remember to have the surgeon office check you blood pressure about oct 2 at your visit there  Please make a Nurse Visit appointment a few days  before your DOT physical to make sure the blood pressure is improved  Please continue all other medications as before, and refills have been done if requested.  Please have the pharmacy call with any other refills you may need.  Please keep your appointments with your specialists as you may have planned  Please return in Oct 11, 2017, or sooner if needed, with Lab testing done 3-5 days before

## 2017-06-07 ENCOUNTER — Encounter: Payer: Self-pay | Admitting: Internal Medicine

## 2017-08-07 ENCOUNTER — Telehealth: Payer: Self-pay | Admitting: Internal Medicine

## 2017-08-07 MED ORDER — LOSARTAN POTASSIUM 100 MG PO TABS: 100.0000 mg | ORAL_TABLET | Freq: Every day | ORAL | 3 refills | Status: DC

## 2017-08-07 NOTE — Telephone Encounter (Signed)
Ok to change to losartan 100  Qd - done erx to pt preferred pharmacy

## 2017-08-07 NOTE — Telephone Encounter (Signed)
Copied from [place]. Topic: Inquiry >> Aug 07, 2017  4:30 PM Bradley Henson wrote: Reason for CRM: pt called Henson/c pt is @ work in [place], the pharmacy there does not have the Rx of irbesartan available, pt is trying to see if there is something that can be sent to them in the meantime until pt arrives back home. Pt has been notified to contact pharmacy to have them contact the practice, the [facility] [place] is located on [address] in [place], contact pt if needed

## 2017-10-11 ENCOUNTER — Ambulatory Visit: Payer: BLUE CROSS/BLUE SHIELD | Admitting: Internal Medicine

## 2017-10-18 ENCOUNTER — Ambulatory Visit: Payer: BLUE CROSS/BLUE SHIELD | Admitting: Internal Medicine

## 2017-10-18 ENCOUNTER — Other Ambulatory Visit (INDEPENDENT_AMBULATORY_CARE_PROVIDER_SITE_OTHER): Payer: BLUE CROSS/BLUE SHIELD

## 2017-10-18 ENCOUNTER — Encounter: Payer: Self-pay | Admitting: Internal Medicine

## 2017-10-18 ENCOUNTER — Other Ambulatory Visit: Payer: Self-pay | Admitting: Internal Medicine

## 2017-10-18 VITALS — BP 122/80 | HR 90 | Temp 98.0°F | Ht 73.0 in | Wt 212.0 lb

## 2017-10-18 DIAGNOSIS — E119 Type 2 diabetes mellitus without complications: Secondary | ICD-10-CM

## 2017-10-18 DIAGNOSIS — I10 Essential (primary) hypertension: Secondary | ICD-10-CM

## 2017-10-18 DIAGNOSIS — J4531 Mild persistent asthma with (acute) exacerbation: Secondary | ICD-10-CM | POA: Diagnosis not present

## 2017-10-18 DIAGNOSIS — Z0000 Encounter for general adult medical examination without abnormal findings: Secondary | ICD-10-CM | POA: Diagnosis not present

## 2017-10-18 LAB — LIPID PANEL
CHOLESTEROL: 180 mg/dL (ref 0–200)
HDL: 42.3 mg/dL (ref >39.00)
NonHDL: 137.6
Total CHOL/HDL Ratio: 4
Triglycerides: 340 mg/dL — ABNORMAL HIGH (ref 0.0–149.0)
VLDL: 68 mg/dL — ABNORMAL HIGH (ref 0.0–40.0)

## 2017-10-18 LAB — BASIC METABOLIC PANEL
BUN: 21 mg/dL (ref 6–23)
CO2: 26 mEq/L (ref 19–32)
CREATININE: 0.93 mg/dL (ref 0.40–1.50)
Calcium: 10.1 mg/dL (ref 8.4–10.5)
Chloride: 100 mEq/L (ref 96–112)
GFR: 91.12 mL/min (ref >60.00)
GLUCOSE: 223 mg/dL — AB (ref 70–99)
Potassium: 4.5 mEq/L (ref 3.5–5.1)
Sodium: 136 mEq/L (ref 135–145)

## 2017-10-18 LAB — LDL CHOLESTEROL, DIRECT: LDL DIRECT: 109 mg/dL

## 2017-10-18 LAB — HEPATIC FUNCTION PANEL
ALK PHOS: 79 U/L (ref 39–117)
ALT: 22 U/L (ref 0–53)
AST: 13 U/L (ref 0–37)
Albumin: 4.5 g/dL (ref 3.5–5.2)
BILIRUBIN DIRECT: 0 mg/dL (ref 0.0–0.3)
BILIRUBIN TOTAL: 0.5 mg/dL (ref 0.2–1.2)
TOTAL PROTEIN: 7.5 g/dL (ref 6.0–8.3)

## 2017-10-18 LAB — HEMOGLOBIN A1C: HEMOGLOBIN A1C: 9.2 % — AB (ref 4.6–6.5)

## 2017-10-18 MED ORDER — EMPAGLIFLOZIN 25 MG PO TABS: 25.0000 mg | ORAL_TABLET | Freq: Every day | ORAL | 3 refills | Status: DC

## 2017-10-18 MED ORDER — GLIPIZIDE ER 10 MG PO TB24: 10.0000 mg | ORAL_TABLET | Freq: Every day | ORAL | 3 refills | Status: DC

## 2017-10-18 MED ORDER — ALBUTEROL SULFATE HFA 108 (90 BASE) MCG/ACT IN AERS: 2.0000 | INHALATION_SPRAY | Freq: Four times a day (QID) | RESPIRATORY_TRACT | 3 refills | Status: DC | PRN

## 2017-10-18 NOTE — Patient Instructions (Addendum)
Please take all new medication as prescribed - the inhaler as needed  Please continue all other medications as before, and refills have been done if requested.  Please have the pharmacy call with any other refills you may need.  Please continue your efforts at being more active, low cholesterol diabetic diet, and weight control.  Please keep your appointments with your specialists as you may have planned  Please go to the LAB in the Basement (turn left off the elevator) for the tests to be done today  You will be contacted by phone if any changes need to be made immediately.  Otherwise, you will receive a letter about your results with an explanation, but please check with MyChart first.  Please remember to sign up for MyChart if you have not done so, as this will be important to you in the future with finding out test results, communicating by private email, and scheduling acute appointments online when needed.  Please return in 6 months, or sooner if needed, with Lab testing done 3-5 days before

## 2017-10-18 NOTE — Progress Notes (Signed)
Subjective:    Patient ID: Bradley Henson, male    DOB: 02/10/1966, 52 y.o.   MRN: [id number]  HPI  Here to f/u; overall doing ok,  Pt denies chest pain, increasing sob or doe, wheezing, orthopnea, PND, increased LE swelling, palpitations, dizziness or syncope.  Pt denies new neurological symptoms such as new headache, or facial or extremity weakness or numbness.  Pt denies polydipsia, polyuria, or low sugar episode.  Pt states overall good compliance with meds, mostly trying to follow appropriate diet, with wt overall stable,  but little exercise however. Has difficulty staying supplied with daily meds due to driving [place] for work.   Not taking the trulicity, for some reason thought it was only for one fill.  Past Medical History:  Diagnosis Date  . ANXIETY 07/17/2007  . ASTHMA, UNSPECIFIED, UNSPECIFIED STATUS 10/27/2010  . DIABETES MELLITUS, TYPE II 07/17/2007  . ELEVATED BLOOD PRESSURE WITHOUT DIAGNOSIS OF HYPERTENSION 10/13/2007  . GERD 07/17/2007  . HERNIA, UMBILICAL 05/02/1883  . HYPERLIPIDEMIA 07/17/2007  . NEPHROLITHIASIS, HX OF 07/17/2007  . PE 06/02/2008  . Proteinuria 07/17/2007  . PSA, INCREASED 10/03/2009  . SKIN LESION 08/04/2010   Past Surgical History:  Procedure Laterality Date  . inguinal herniorrhapy    . s/p nasal surgury and palatoplasty      reports that he has been smoking.  he has never used smokeless tobacco. He reports that he does not drink alcohol or use drugs. family history includes Diabetes in his other. No Known Allergies Current Outpatient Medications on File Prior to Visit  Medication Sig Dispense Refill  . amLODipine (NORVASC) 5 MG tablet Take 1 tablet (5 mg total) by mouth daily. 90 tablet 3  . aspirin 81 MG tablet Take 81 mg by mouth daily.      Marland Kitchen losartan (COZAAR) 100 MG tablet Take 1 tablet (100 mg total) by mouth daily. 90 tablet 3  . metFORMIN (GLUCOPHAGE) 1000 MG tablet Take 1 tablet (1,000 mg total) by mouth 2 (two) times daily with a meal.  180 tablet 3  . rosuvastatin (CRESTOR) 20 MG tablet Take 1 tablet (20 mg total) by mouth daily. 90 tablet 3   No current facility-administered medications on file prior to visit.     Review of Systems Constitutional: Negative for other unusual diaphoresis, sweats, appetite or weight changes HENT: Negative for other worsening hearing loss, ear pain, facial swelling, mouth sores or neck stiffness.   Eyes: Negative for other worsening pain, redness or other visual disturbance.  Respiratory: Negative for other stridor or swelling Cardiovascular: Negative for other palpitations or other chest pain  Gastrointestinal: Negative for worsening diarrhea or loose stools, blood in stool, distention or other pain Genitourinary: Negative for hematuria, flank pain or other change in urine volume.  Musculoskeletal: Negative for myalgias or other joint swelling.  Skin: Negative for other color change, or other wound or worsening drainage.  Neurological: Negative for other syncope or numbness. Hematological: Negative for other adenopathy or swelling Psychiatric/Behavioral: Negative for hallucinations, other worsening agitation, SI, self-injury, or new decreased concentration All other syste neg per pt    Objective:   Physical Exam BP 122/80   Pulse 90   Temp 98 F (36.7 C) (Oral)   Ht 6\' 1"  (1.854 m)   Wt 212 lb (96.2 kg)   SpO2 98%   BMI 27.97 kg/m  VS noted,  Constitutional: Pt is oriented to person, place, and time. Appears well-developed and well-nourished, in no significant distress and comfortable  Head: Normocephalic and atraumatic  Eyes: Conjunctivae and EOM are normal. Pupils are equal, round, and reactive to light Right Ear: External ear normal without discharge Left Ear: External ear normal without discharge Nose: Nose without discharge or deformity Mouth/Throat: Oropharynx is without other ulcerations and moist  Neck: Normal range of motion. Neck supple. No JVD present. No tracheal  deviation present or significant neck LA or mass Cardiovascular: Normal rate, regular rhythm, normal heart sounds and intact distal pulses.   Pulmonary/Chest: WOB normal and breath sounds without rales but with mild bilat wheezing  Abdominal: Soft. Bowel sounds are normal. NT. No HSM  Musculoskeletal: Normal range of motion. Exhibits no edema Lymphadenopathy: Has no other cervical adenopathy.  Neurological: Pt is alert and oriented to person, place, and time. Pt has normal reflexes. No cranial nerve deficit. Motor grossly intact, Gait intact Skin: Skin is warm and dry. No rash noted or new ulcerations Psychiatric:  Has normal mood and affect. Behavior is normal without agitation No other exam findings    Assessment & Plan:

## 2017-10-19 NOTE — Assessment & Plan Note (Signed)
With suboptimal control by exam though he minimizes any symptoms, for albuterol HFA prn

## 2017-10-19 NOTE — Assessment & Plan Note (Signed)
stable overall by history and exam, recent data reviewed with pt, and pt to continue medical treatment as before,  to f/u any worsening symptoms or concerns BP Readings from Last 3 Encounters:  10/18/17 122/80  05/17/17 (!) 182/106  04/05/17 (!) 194/120

## 2017-10-19 NOTE — Assessment & Plan Note (Signed)
?   Control, for f/u a1c,  to f/u any worsening symptoms or concerns

## 2017-10-21 ENCOUNTER — Telehealth: Payer: Self-pay

## 2017-10-21 NOTE — Telephone Encounter (Signed)
Pt has viewed results via MyChart  

## 2017-10-21 NOTE — Telephone Encounter (Signed)
-----   Message from Biagio Borg, MD sent at 10/18/2017  8:14 PM EST ----- Left message on MyChart, pt to cont same tx except  The test results show that your current treatment is OK, except the A1c is worsening.  Please increase the glipizide to 10 mg, and add Jardiance 25 mg per day, and continue all other medications.  I will send new prescriptions, and you should hear from the office as well.   Berniece Abid to please inform pt, I will do rx x 2

## 2018-04-06 ENCOUNTER — Other Ambulatory Visit: Payer: Self-pay | Admitting: Internal Medicine

## 2018-04-21 ENCOUNTER — Ambulatory Visit: Payer: Self-pay | Admitting: Internal Medicine

## 2018-04-21 DIAGNOSIS — Z0289 Encounter for other administrative examinations: Secondary | ICD-10-CM

## 2018-05-12 ENCOUNTER — Other Ambulatory Visit: Payer: Self-pay | Admitting: Internal Medicine

## 2018-09-28 ENCOUNTER — Other Ambulatory Visit: Payer: Self-pay | Admitting: Internal Medicine

## 2018-11-07 ENCOUNTER — Encounter: Payer: Self-pay | Admitting: Gynecology

## 2018-11-07 ENCOUNTER — Ambulatory Visit
Admission: EM | Admit: 2018-11-07 | Discharge: 2018-11-07 | Disposition: A | Discharge status: Home / Self Care | Payer: Managed Care, Other (non HMO) | Attending: Family Medicine | Admitting: Family Medicine

## 2018-11-07 ENCOUNTER — Other Ambulatory Visit: Payer: Self-pay

## 2018-11-07 DIAGNOSIS — J111 Influenza due to unidentified influenza virus with other respiratory manifestations: Secondary | ICD-10-CM | POA: Diagnosis not present

## 2018-11-07 DIAGNOSIS — R05 Cough: Secondary | ICD-10-CM | POA: Diagnosis not present

## 2018-11-07 DIAGNOSIS — F17210 Nicotine dependence, cigarettes, uncomplicated: Secondary | ICD-10-CM

## 2018-11-07 DIAGNOSIS — M791 Myalgia, unspecified site: Secondary | ICD-10-CM | POA: Diagnosis not present

## 2018-11-07 DIAGNOSIS — R509 Fever, unspecified: Secondary | ICD-10-CM | POA: Diagnosis not present

## 2018-11-07 LAB — RAPID INFLUENZA A&B ANTIGENS
Influenza A (ARMC): POSITIVE — AB
Influenza B (ARMC): NEGATIVE

## 2018-11-07 MED ORDER — OSELTAMIVIR PHOSPHATE 75 MG PO CAPS: 75.0000 mg | ORAL_CAPSULE | Freq: Two times a day (BID) | ORAL | 0 refills | Status: DC

## 2018-11-07 NOTE — ED Provider Notes (Signed)
[facility]    CSN: [id number] Arrival date & time: 11/07/18  1649  History   Chief Complaint Cough, fever, body aches  HPI  53 year old male presents with the above complaints.  Patient reports that he has had ongoing cough for the past 2 weeks.  Patient states that he has recently developed body aches and headache.  He insinuates that this started on Monday.  He seems to be unclear about the exact timing of his symptoms.  He did have a fever today of 101.1.  This is the first time that his temperature has been checked.  His symptoms are severe.  No known exacerbating/relieving factors.  No reported sick contacts.  No other complaints.  PMH, Surgical Hx, Family Hx, Social History reviewed and updated as below.  Past Medical History:  Diagnosis Date  . ANXIETY 07/17/2007  . ASTHMA, UNSPECIFIED, UNSPECIFIED STATUS 10/27/2010  . DIABETES MELLITUS, TYPE II 07/17/2007  . ELEVATED BLOOD PRESSURE WITHOUT DIAGNOSIS OF HYPERTENSION 10/13/2007  . GERD 07/17/2007  . HERNIA, UMBILICAL 0/01/2693  . HYPERLIPIDEMIA 07/17/2007  . NEPHROLITHIASIS, HX OF 07/17/2007  . PE 06/02/2008  . Proteinuria 07/17/2007  . PSA, INCREASED 10/03/2009  . SKIN LESION 08/04/2010   Patient Active Problem List   Diagnosis Date Noted  . Lipoma 04/05/2017  . HTN (hypertension) 04/05/2017  . Umbilical hernia 85/46/2703  . Encounter for well adult exam with abnormal findings 04/29/2011  . Asthma 10/27/2010  . SKIN LESION 08/04/2010  . PSA, INCREASED 10/03/2009  . PE 06/02/2008  . ELEVATED BLOOD PRESSURE WITHOUT DIAGNOSIS OF HYPERTENSION 10/13/2007  . Diabetes ([place]) 07/17/2007  . Hyperlipidemia 07/17/2007  . ANXIETY 07/17/2007  . GERD 07/17/2007  . Proteinuria 07/17/2007  . NEPHROLITHIASIS, HX OF 07/17/2007   Past Surgical History:  Procedure Laterality Date  . inguinal herniorrhapy    . s/p nasal surgury and palatoplasty     Home Medications    Prior to Admission medications   Medication Sig  Start Date End Date Taking? Authorizing Provider  amLODipine (NORVASC) 5 MG tablet Take 1 tablet (5 mg total) by mouth daily. 05/17/17  Yes Biagio Borg, MD  aspirin 81 MG tablet Take 81 mg by mouth daily.     Yes [provider]  glipiZIDE (GLUCOTROL XL) 10 MG 24 hr tablet Take 1 tablet (10 mg total) by mouth daily with breakfast. 10/18/17  Yes Biagio Borg, MD  irbesartan (AVAPRO) 300 MG tablet TAKE 1 TABLET BY MOUTH EVERY DAY 04/07/18   Biagio Borg, MD  losartan (COZAAR) 100 MG tablet Take 1 tablet (100 mg total) by mouth daily. 08/07/17   Biagio Borg, MD  metFORMIN (GLUCOPHAGE) 1000 MG tablet TAKE 1 TABLET (1,000 MG TOTAL) BY MOUTH 2 (TWO) TIMES DAILY WITH A MEAL. 05/12/18   Biagio Borg, MD  oseltamivir (TAMIFLU) 75 MG capsule Take 1 capsule (75 mg total) by mouth every 12 (twelve) hours. 11/07/18   Coral Spikes, DO   Family History Family History  Problem Relation Age of Onset  . Diabetes Other    Social History Social History   Tobacco Use  . Smoking status: Current Every Day Smoker  . Smokeless tobacco: Never Used  Substance Use Topics  . Alcohol use: No  . Drug use: No   Allergies   Patient has no known allergies.   Review of Systems Review of Systems  Constitutional: Positive for fever.  Respiratory: Positive for cough.   Musculoskeletal:  Body aches.  Neurological: Positive for headaches.    Physical Exam Triage Vital Signs ED Triage Vitals  Enc Vitals Group     BP 11/07/18 1707 (!) 155/92     Pulse Rate 11/07/18 1707 (!) 118     Resp 11/07/18 1707 16     Temp 11/07/18 1707 98.6 F (37 C)     Temp Source 11/07/18 1707 Oral     SpO2 11/07/18 1707 96 %     Weight 11/07/18 1709 200 lb (90.7 kg)     Height 11/07/18 1709 6' (1.829 m)     Head Circumference --      Peak Flow --      Pain Score 11/07/18 1709 7     Pain Loc --      Pain Edu? --      Excl. in [place]? --    Updated Vital Signs BP (!) 155/92 (BP Location: Left Arm)   Pulse (!)  118   Temp 98.6 F (37 C) (Oral)   Resp 16   Ht 6' (1.829 m)   Wt 90.7 kg   SpO2 96%   BMI 27.12 kg/m   Visual Acuity Right Eye Distance:   Left Eye Distance:   Bilateral Distance:    Right Eye Near:   Left Eye Near:    Bilateral Near:     Physical Exam Vitals signs and nursing note reviewed.  Constitutional:      General: He is not in acute distress.    Appearance: Normal appearance.  HENT:     Head: Normocephalic and atraumatic.     Nose: Nose normal. No rhinorrhea.  Eyes:     General:        Right eye: No discharge.        Left eye: No discharge.     Conjunctiva/sclera: Conjunctivae normal.  Cardiovascular:     Rate and Rhythm: Regular rhythm. Tachycardia present.  Pulmonary:     Effort: Pulmonary effort is normal.     Breath sounds: Normal breath sounds.  Neurological:     Mental Status: He is alert.  Psychiatric:        Mood and Affect: Mood normal.        Behavior: Behavior normal.    UC Treatments / Results  Labs (all labs ordered are listed, but only abnormal results are displayed) Labs Reviewed  RAPID INFLUENZA A&B ANTIGENS ([facility] ONLY) - Abnormal; Notable for the following components:      Result Value   Influenza A ([facility]) POSITIVE (*)    All other components within normal limits    EKG None  Radiology No results found.  Procedures Procedures (including critical care time)  Medications Ordered in UC Medications - No data to display  Initial Impression / Assessment and Plan / UC Course  I have reviewed the triage vital signs and the nursing notes.  Pertinent labs & imaging results that were available during my care of the patient were reviewed by me and considered in my medical decision making (see chart for details).    53 year old male presents with influenza.  Treating with Tamiflu.  Final Clinical Impressions(s) / UC Diagnoses   Final diagnoses:  Influenza   Discharge Instructions   None    ED Prescriptions    Medication  Sig Dispense Auth. Provider   oseltamivir (TAMIFLU) 75 MG capsule Take 1 capsule (75 mg total) by mouth every 12 (twelve) hours. 10 capsule Coral Spikes, DO  Controlled Substance Prescriptions [place] Controlled Substance Registry consulted? Not Applicable   Coral Spikes, DO 11/07/18 2136

## 2018-11-07 NOTE — ED Triage Notes (Signed)
Patient c/o cough for several weeks/ nasal congestion body ache / fever at home of 101.1

## 2018-12-05 ENCOUNTER — Telehealth: Payer: Self-pay | Admitting: Internal Medicine

## 2018-12-05 NOTE — Telephone Encounter (Signed)
Copied from [place] [id number]. Topic: Quick Communication - Rx Refill/Question >> Dec 05, 2018 11:55 AM Percell Belt A wrote: Medication: irbesartan (AVAPRO) 300 MG tablet [[id number]  Has the patient contacted their pharmacy? No  (Agent: If no, request that the patient contact the pharmacy for the refill.) (Agent: If yes, when and what did the pharmacy advise?)  Preferred Pharmacy (with phone number or street name)[phone]- [facility] [place]   Agent: Please be advised that RX refills may take up to 3 business days. We ask that you follow-up with your pharmacy.

## 2018-12-08 ENCOUNTER — Telehealth: Payer: Self-pay

## 2018-12-08 MED ORDER — IRBESARTAN 300 MG PO TABS: 300.0000 mg | ORAL_TABLET | Freq: Every day | ORAL | 1 refills | Status: DC

## 2018-12-08 NOTE — Telephone Encounter (Signed)
Copied from [place] [id number]. Topic: Quick Communication - Rx Refill/Question >> Dec 05, 2018 11:55 AM Percell Belt A wrote: Medication: irbesartan (AVAPRO) 300 MG tablet [[id number]  Has the patient contacted their pharmacy? No  (Agent: If no, request that the patient contact the pharmacy for the refill.) (Agent: If yes, when and what did the pharmacy advise?)  Preferred Pharmacy (with phone number or street name)([phone]- [facility] [place]   Agent: Please be advised that RX refills may take up to 3 business days. We ask that you follow-up with your pharmacy.

## 2018-12-08 NOTE — Telephone Encounter (Signed)
Pt has not seen MD since 09/2017. Request was denied before w/Md stating need OV. Denied refill.Marland KitchenJohny Henson

## 2019-04-27 ENCOUNTER — Encounter: Payer: Self-pay | Admitting: Internal Medicine

## 2019-04-27 ENCOUNTER — Ambulatory Visit (INDEPENDENT_AMBULATORY_CARE_PROVIDER_SITE_OTHER): Payer: Managed Care, Other (non HMO) | Admitting: Internal Medicine

## 2019-04-27 ENCOUNTER — Other Ambulatory Visit: Payer: Self-pay

## 2019-04-27 VITALS — BP 116/78 | HR 72 | Temp 97.6°F | Ht 72.0 in | Wt 201.0 lb

## 2019-04-27 DIAGNOSIS — K429 Umbilical hernia without obstruction or gangrene: Secondary | ICD-10-CM

## 2019-04-27 DIAGNOSIS — E611 Iron deficiency: Secondary | ICD-10-CM | POA: Diagnosis not present

## 2019-04-27 DIAGNOSIS — L989 Disorder of the skin and subcutaneous tissue, unspecified: Secondary | ICD-10-CM

## 2019-04-27 DIAGNOSIS — Z0000 Encounter for general adult medical examination without abnormal findings: Secondary | ICD-10-CM

## 2019-04-27 DIAGNOSIS — E119 Type 2 diabetes mellitus without complications: Secondary | ICD-10-CM

## 2019-04-27 DIAGNOSIS — I10 Essential (primary) hypertension: Secondary | ICD-10-CM

## 2019-04-27 DIAGNOSIS — E538 Deficiency of other specified B group vitamins: Secondary | ICD-10-CM | POA: Diagnosis not present

## 2019-04-27 DIAGNOSIS — E559 Vitamin D deficiency, unspecified: Secondary | ICD-10-CM

## 2019-04-27 DIAGNOSIS — F17200 Nicotine dependence, unspecified, uncomplicated: Secondary | ICD-10-CM

## 2019-04-27 DIAGNOSIS — Z0001 Encounter for general adult medical examination with abnormal findings: Secondary | ICD-10-CM

## 2019-04-27 DIAGNOSIS — D179 Benign lipomatous neoplasm, unspecified: Secondary | ICD-10-CM

## 2019-04-27 DIAGNOSIS — E785 Hyperlipidemia, unspecified: Secondary | ICD-10-CM

## 2019-04-27 MED ORDER — GLIPIZIDE ER 10 MG PO TB24: 10.0000 mg | ORAL_TABLET | Freq: Every day | ORAL | 3 refills | Status: DC

## 2019-04-27 MED ORDER — METFORMIN HCL 1000 MG PO TABS: 1000.0000 mg | ORAL_TABLET | Freq: Two times a day (BID) | ORAL | 3 refills | Status: DC

## 2019-04-27 MED ORDER — LOSARTAN POTASSIUM 100 MG PO TABS: 100.0000 mg | ORAL_TABLET | Freq: Every day | ORAL | 3 refills | Status: DC

## 2019-04-27 MED ORDER — AMLODIPINE BESYLATE 5 MG PO TABS: 5.0000 mg | ORAL_TABLET | Freq: Every day | ORAL | 3 refills | Status: DC

## 2019-04-27 MED ORDER — JARDIANCE 25 MG PO TABS: 25.0000 mg | ORAL_TABLET | Freq: Every day | ORAL | 3 refills | Status: DC

## 2019-04-27 NOTE — Assessment & Plan Note (Signed)

## 2019-04-27 NOTE — Assessment & Plan Note (Signed)
Mild symptomatic, for gen surgury referral

## 2019-04-27 NOTE — Assessment & Plan Note (Addendum)
Scaly raise, cant r/o malignancy, for derm referral  In addition to the time spent performing CPE, I spent an additional 25 minutes face to face,in which greater than 50% of this time was spent in counseling and coordination of care for patient's acute illness as documented, including the differential dx, treatment, further evaluation and other management of skin lesion, smoker, umbilical hernia, uncontrolled DM, HTn. HLD, lipoma

## 2019-04-27 NOTE — Assessment & Plan Note (Signed)
Counseled to quit, not ready at this time 

## 2019-04-27 NOTE — Progress Notes (Signed)
Subjective:    Patient ID: Bradley Henson, male    DOB: 1966-08-09, 53 y.o.   MRN: [id number]  HPI   Here for wellness and f/u;  Overall doing ok;  Pt denies Chest pain, worsening SOB, DOE, wheezing, orthopnea, PND, worsening LE edema, palpitations, dizziness or syncope.  Pt denies neurological change such as new headache, facial or extremity weakness.  Pt denies polydipsia, polyuria, or low sugar symptoms. Pt states overall good compliance with treatment and medications, good tolerability, and has been trying to follow appropriate diet.  Pt denies worsening depressive symptoms, suicidal ideation or panic. No fever, night sweats, wt loss, loss of appetite, or other constitutional symptoms.  Pt states good ability with ADL's, has low fall risk, home safety reviewed and adequate, no other significant changes in hearing or vision, and only occasionally active with exercise. Also Seen at [facility] with sugar 240, has been out for several months of meds.  Plans to see optho soon, but cannot afford now.  Needs to see general surgury for left upper chest lipoma large, and umbilical hernia.  Lipoma somewhat larger, no pain, but has had some intermittent mild hernia discomfort reducible. Also has 2 mo worsening scaly raised skin lesion to right later upper leg.  Still smoking, not ready to quit.  Past Medical History:  Diagnosis Date  . ANXIETY 07/17/2007  . ASTHMA, UNSPECIFIED, UNSPECIFIED STATUS 10/27/2010  . DIABETES MELLITUS, TYPE II 07/17/2007  . ELEVATED BLOOD PRESSURE WITHOUT DIAGNOSIS OF HYPERTENSION 10/13/2007  . GERD 07/17/2007  . HERNIA, UMBILICAL 0000000  . HYPERLIPIDEMIA 07/17/2007  . NEPHROLITHIASIS, HX OF 07/17/2007  . PE 06/02/2008  . Proteinuria 07/17/2007  . PSA, INCREASED 10/03/2009  . SKIN LESION 08/04/2010   Past Surgical History:  Procedure Laterality Date  . inguinal herniorrhapy    . s/p nasal surgury and palatoplasty      reports that he has been smoking. He has never used smokeless  tobacco. He reports that he does not drink alcohol or use drugs. family history includes Diabetes in an other family member. No Known Allergies Current Outpatient Medications on File Prior to Visit  Medication Sig Dispense Refill  . aspirin 81 MG tablet Take 81 mg by mouth daily.       No current facility-administered medications on file prior to visit.    Review of Systems Constitutional: Negative for other unusual diaphoresis, sweats, appetite or weight changes HENT: Negative for other worsening hearing loss, ear pain, facial swelling, mouth sores or neck stiffness.   Eyes: Negative for other worsening pain, redness or other visual disturbance.  Respiratory: Negative for other stridor or swelling Cardiovascular: Negative for other palpitations or other chest pain  Gastrointestinal: Negative for worsening diarrhea or loose stools, blood in stool, distention or other pain Genitourinary: Negative for hematuria, flank pain or other change in urine volume.  Musculoskeletal: Negative for myalgias or other joint swelling.  Skin: Negative for other color change, or other wound or worsening drainage.  Neurological: Negative for other syncope or numbness. Hematological: Negative for other adenopathy or swelling Psychiatric/Behavioral: Negative for hallucinations, other worsening agitation, SI, self-injury, or new decreased concentration All other system neg per pt    Objective:   Physical Exam BP 116/78   Pulse 72   Temp 97.6 F (36.4 C) (Oral)   Ht 6' (1.829 m)   Wt 201 lb (91.2 kg)   SpO2 96%   BMI 27.26 kg/m  VS noted,  Constitutional: Pt is oriented to person, place,  and time. Appears well-developed and well-nourished, in no significant distress and comfortable Head: Normocephalic and atraumatic  Eyes: Conjunctivae and EOM are normal. Pupils are equal, round, and reactive to light Right Ear: External ear normal without discharge Left Ear: External ear normal without discharge  Nose: Nose without discharge or deformity Mouth/Throat: Oropharynx is without other ulcerations and moist  Neck: Normal range of motion. Neck supple. No JVD present. No tracheal deviation present or significant neck LA or mass Cardiovascular: Normal rate, regular rhythm, normal heart sounds and intact distal pulses.   Pulmonary/Chest: WOB normal and breath sounds without rales or wheezing  Abdominal: Soft. Bowel sounds are normal. NT. No HSM , NT umbilical hernia Musculoskeletal: Normal range of motion. Exhibits no edema Lymphadenopathy: Has no other cervical adenopathy.  Neurological: Pt is alert and oriented to person, place, and time. Pt has normal reflexes. No cranial nerve deficit. Motor grossly intact, Gait intact Skin: Skin is warm and dry. No rash noted or new ulcerations, large lipoma to left upper anterior chest wall Psychiatric:  Has normal mood and affect. Behavior is normal without agitation Lab Results  Component Value Date   WBC 8.9 04/05/2017   HGB 16.4 04/05/2017   HCT 48.4 04/05/2017   PLT 287.0 04/05/2017   GLUCOSE 223 (H) 10/18/2017   CHOL 180 10/18/2017   TRIG 340.0 (H) 10/18/2017   HDL 42.30 10/18/2017   LDLDIRECT 109.0 10/18/2017   LDLCALC 115 (H) 04/05/2017   ALT 22 10/18/2017   AST 13 10/18/2017   NA 136 10/18/2017   K 4.5 10/18/2017   CL 100 10/18/2017   CREATININE 0.93 10/18/2017   BUN 21 10/18/2017   CO2 26 10/18/2017   TSH 1.64 04/05/2017   PSA 1.08 04/05/2017   INR 2.6 02/11/2009   HGBA1C 9.2 (H) 10/18/2017   MICROALBUR 4.2 (H) 04/05/2017          Assessment & Plan:

## 2019-04-27 NOTE — Patient Instructions (Signed)
Please continue all other medications as before, including restarting your medications  Please have the pharmacy call with any other refills you may need.  Please continue your efforts at being more active, low cholesterol diet, and weight control.  You are otherwise up to date with prevention measures today.  Please keep your appointments with your specialists as you may have planned  You will be contacted regarding the referral for: Colonoscopy, General surgury and Dermatology  Please go to the LAB in the Basement (turn left off the elevator) for the tests to be done today  You will be contacted by phone if any changes need to be made immediately.  Otherwise, you will receive a letter about your results with an explanation, but please check with MyChart first.  Please remember to sign up for MyChart if you have not done so, as this will be important to you in the future with finding out test results, communicating by private email, and scheduling acute appointments online when needed.  Please return in 6 months, or sooner if needed, with Lab testing done 3-5 days before

## 2019-04-27 NOTE — Assessment & Plan Note (Signed)
stable overall by history and exam, recent data reviewed with pt, and pt to continue medical treatment as before,  to f/u any worsening symptoms or concerns  

## 2019-04-27 NOTE — Assessment & Plan Note (Signed)
Also for general surgury referral

## 2019-05-01 ENCOUNTER — Telehealth: Payer: Self-pay | Admitting: Internal Medicine

## 2019-05-01 NOTE — Telephone Encounter (Signed)
We received a [employer] Diabetic Clearance form via fax to be completed for patient.   Patient has been informed he needs to completed his lab work. He will come by the lab once he is back in town, He is out on the road for work.   I will hold form until labs are completed.

## 2019-05-08 ENCOUNTER — Other Ambulatory Visit (INDEPENDENT_AMBULATORY_CARE_PROVIDER_SITE_OTHER): Payer: Managed Care, Other (non HMO)

## 2019-05-08 DIAGNOSIS — E119 Type 2 diabetes mellitus without complications: Secondary | ICD-10-CM

## 2019-05-08 DIAGNOSIS — E559 Vitamin D deficiency, unspecified: Secondary | ICD-10-CM | POA: Diagnosis not present

## 2019-05-08 DIAGNOSIS — Z0001 Encounter for general adult medical examination with abnormal findings: Secondary | ICD-10-CM | POA: Diagnosis not present

## 2019-05-08 DIAGNOSIS — E538 Deficiency of other specified B group vitamins: Secondary | ICD-10-CM | POA: Diagnosis not present

## 2019-05-08 DIAGNOSIS — E611 Iron deficiency: Secondary | ICD-10-CM | POA: Diagnosis not present

## 2019-05-08 LAB — URINALYSIS, ROUTINE W REFLEX MICROSCOPIC
Bilirubin Urine: NEGATIVE
Ketones, ur: NEGATIVE
Leukocytes,Ua: NEGATIVE
Nitrite: NEGATIVE
Specific Gravity, Urine: 1.01 (ref 1.000–1.030)
Total Protein, Urine: NEGATIVE
Urine Glucose: >=1000 — AB
Urobilinogen, UA: 0.2 (ref 0.0–1.0)
pH: 5.5 (ref 5.0–8.0)

## 2019-05-08 LAB — CBC WITH DIFFERENTIAL/PLATELET
Basophils Absolute: 0.1 10*3/uL (ref 0.0–0.1)
Basophils Relative: 1 % (ref 0.0–3.0)
Eosinophils Absolute: 0.1 10*3/uL (ref 0.0–0.7)
Eosinophils Relative: 0.5 % (ref 0.0–5.0)
HCT: 49.3 % (ref 39.0–52.0)
Hemoglobin: 16.7 g/dL (ref 13.0–17.0)
Lymphocytes Relative: 27 % (ref 12.0–46.0)
Lymphs Abs: 3.5 10*3/uL (ref 0.7–4.0)
MCHC: 33.8 g/dL (ref 30.0–36.0)
MCV: 92.2 fl (ref 78.0–100.0)
Monocytes Absolute: 1.1 10*3/uL — ABNORMAL HIGH (ref 0.1–1.0)
Monocytes Relative: 8.2 % (ref 3.0–12.0)
Neutro Abs: 8.3 10*3/uL — ABNORMAL HIGH (ref 1.4–7.7)
Neutrophils Relative %: 63.3 % (ref 43.0–77.0)
Platelets: 291 10*3/uL (ref 150.0–400.0)
RBC: 5.34 Mil/uL (ref 4.22–5.81)
RDW: 13.3 % (ref 11.5–15.5)
WBC: 13.1 10*3/uL — ABNORMAL HIGH (ref 4.0–10.5)

## 2019-05-08 LAB — HEPATIC FUNCTION PANEL
ALT: 14 U/L (ref 0–53)
AST: 10 U/L (ref 0–37)
Albumin: 4.5 g/dL (ref 3.5–5.2)
Alkaline Phosphatase: 92 U/L (ref 39–117)
Bilirubin, Direct: 0.1 mg/dL (ref 0.0–0.3)
Total Bilirubin: 0.4 mg/dL (ref 0.2–1.2)
Total Protein: 7.1 g/dL (ref 6.0–8.3)

## 2019-05-08 LAB — PSA: PSA: 1.04 ng/mL (ref 0.10–4.00)

## 2019-05-08 LAB — IBC PANEL
Iron: 34 ug/dL — ABNORMAL LOW (ref 42–165)
Saturation Ratios: 9 % — ABNORMAL LOW (ref 20.0–50.0)
Transferrin: 269 mg/dL (ref 212.0–360.0)

## 2019-05-08 LAB — BASIC METABOLIC PANEL
BUN: 16 mg/dL (ref 6–23)
CO2: 25 mEq/L (ref 19–32)
Calcium: 9.7 mg/dL (ref 8.4–10.5)
Chloride: 101 mEq/L (ref 96–112)
Creatinine, Ser: 0.97 mg/dL (ref 0.40–1.50)
GFR: 81.17 mL/min (ref >60.00)
Glucose, Bld: 126 mg/dL — ABNORMAL HIGH (ref 70–99)
Potassium: 4.4 mEq/L (ref 3.5–5.1)
Sodium: 136 mEq/L (ref 135–145)

## 2019-05-08 LAB — LIPID PANEL
Cholesterol: 142 mg/dL (ref 0–200)
HDL: 35.6 mg/dL — ABNORMAL LOW (ref >39.00)
NonHDL: 106.16
Total CHOL/HDL Ratio: 4
Triglycerides: 204 mg/dL — ABNORMAL HIGH (ref 0.0–149.0)
VLDL: 40.8 mg/dL — ABNORMAL HIGH (ref 0.0–40.0)

## 2019-05-08 LAB — MICROALBUMIN / CREATININE URINE RATIO
Creatinine,U: 45.8 mg/dL
Microalb Creat Ratio: 1.5 mg/g (ref 0.0–30.0)
Microalb, Ur: <0.7 mg/dL (ref 0.0–1.9)

## 2019-05-08 LAB — VITAMIN B12: Vitamin B-12: 216 pg/mL (ref 211–911)

## 2019-05-08 LAB — LDL CHOLESTEROL, DIRECT: Direct LDL: 92 mg/dL

## 2019-05-08 LAB — HEMOGLOBIN A1C: Hgb A1c MFr Bld: 11 % — ABNORMAL HIGH (ref 4.6–6.5)

## 2019-05-08 LAB — TSH: TSH: 2 u[IU]/mL (ref 0.35–4.50)

## 2019-05-08 LAB — VITAMIN D 25 HYDROXY (VIT D DEFICIENCY, FRACTURES): VITD: 11.57 ng/mL — ABNORMAL LOW (ref 30.00–100.00)

## 2019-05-09 ENCOUNTER — Other Ambulatory Visit: Payer: Self-pay | Admitting: Internal Medicine

## 2019-05-09 MED ORDER — VITAMIN D (ERGOCALCIFEROL) 1.25 MG (50000 UNIT) PO CAPS: 50000.0000 [IU] | ORAL_CAPSULE | ORAL | 0 refills | Status: DC

## 2019-05-11 ENCOUNTER — Telehealth: Payer: Self-pay

## 2019-05-11 NOTE — Telephone Encounter (Signed)
Pt has had blood work, has form been sent ?

## 2019-05-11 NOTE — Telephone Encounter (Signed)
-----   Message from Biagio Borg, MD sent at 05/09/2019  1:25 PM EDT ----- Left message on MyChart, pt to cont same tx except  The test results show that your current treatment is OK, as the tests are stable, except for a low Vitamin D level, and severely elevated A1c.    We need to: 1) Please take Vitamin D 50000 units weekly for 12 weeks, then plan to change to OTC Vitamin D3 at 2000 units per day, indefinitely. 2) Restart all of your medications as we mentioned at your visit, and please return in 3 months (instead of 6 months as we said before).    Shirron to please inform pt, I will do rx, and ask pt to make ROV in 3 mo

## 2019-05-11 NOTE — Telephone Encounter (Signed)
Do you still have this form? Please follow up with pt. Thanks!

## 2019-05-11 NOTE — Telephone Encounter (Signed)
Pt has viewed results via MyChart  

## 2019-05-12 DIAGNOSIS — Z0279 Encounter for issue of other medical certificate: Secondary | ICD-10-CM

## 2019-05-12 NOTE — Telephone Encounter (Signed)
Completed forms have been given back to [place].

## 2019-05-12 NOTE — Telephone Encounter (Signed)
Ok this is done 

## 2019-05-12 NOTE — Telephone Encounter (Signed)
Form has been completed & placed in providers box to review and sign.  °

## 2019-05-12 NOTE — Telephone Encounter (Signed)
Faxed, Copy sent to scan &Charged for.   Patient informed &Orginail mailed to him for his records.

## 2019-05-12 NOTE — Telephone Encounter (Signed)
I do have the forms, I have been out of office. I will completed today.

## 2019-05-26 ENCOUNTER — Other Ambulatory Visit: Payer: Self-pay | Admitting: Internal Medicine

## 2019-06-02 ENCOUNTER — Encounter: Payer: Self-pay | Admitting: Internal Medicine

## 2019-06-17 ENCOUNTER — Encounter: Payer: Self-pay | Admitting: Internal Medicine

## 2019-07-29 ENCOUNTER — Ambulatory Visit: Payer: Self-pay | Admitting: General Surgery

## 2019-08-18 ENCOUNTER — Other Ambulatory Visit: Payer: Self-pay | Admitting: Internal Medicine

## 2019-08-18 NOTE — Telephone Encounter (Signed)
No need further vit D high dose  , then plan to change to OTC Vitamin D3 at 2000 units per day, indefinitely.

## 2019-08-18 NOTE — Telephone Encounter (Signed)
Called pt there was no answer LMOM w/MD response../lmb 

## 2019-09-23 NOTE — Progress Notes (Signed)
Pre-op call attempted. No answer. Lmtcb.

## 2019-09-24 ENCOUNTER — Other Ambulatory Visit: Payer: Self-pay

## 2019-09-24 ENCOUNTER — Encounter (HOSPITAL_BASED_OUTPATIENT_CLINIC_OR_DEPARTMENT_OTHER): Payer: Self-pay | Admitting: General Surgery

## 2019-09-24 NOTE — Progress Notes (Addendum)
PCP - Biagio Borg, MD Cardiologist - none   Chest x-ray - none   EKG - ordered Stress Test - none  ECHO - none  Cardiac Cath - none Needs BMP, EKG :patient plans to see his PCP for annual check of diabetes and kidney fx next week prior to surgery . ekg ordered to be obtained when patient picks up drink and chg on 2/2   Sleep Study - none  CPAP - n/a  Fasting Blood Sugar - does not monitor at home  Checks Blood Sugar __0___ times a day  Blood Thinner Instructions:n/a Aspirin Instructions: per patient , plans to hold x1 week for surgery  Last Dose: ~1/29  Anesthesia review:  None indicated   Patient denies shortness of breath, fever, cough and chest pain at PAT appointment   Patient verbalized understanding of instructions that were given to them at the PAT appointment. Patient was also instructed that they will need to review over the PAT instructions again at home before surgery.

## 2019-09-30 ENCOUNTER — Other Ambulatory Visit
Admission: RE | Admit: 2019-09-30 | Payer: Managed Care, Other (non HMO) | Source: Ambulatory Visit | Attending: General Surgery | Admitting: General Surgery

## 2019-09-30 ENCOUNTER — Encounter (HOSPITAL_BASED_OUTPATIENT_CLINIC_OR_DEPARTMENT_OTHER)
Admission: RE | Admit: 2019-09-30 | Discharge: 2019-09-30 | Disposition: A | Discharge status: Home / Self Care | Payer: Managed Care, Other (non HMO) | Source: Ambulatory Visit | Attending: General Surgery | Admitting: General Surgery

## 2019-09-30 ENCOUNTER — Other Ambulatory Visit: Payer: Self-pay

## 2019-09-30 ENCOUNTER — Ambulatory Visit: Payer: Managed Care, Other (non HMO) | Attending: Internal Medicine

## 2019-09-30 DIAGNOSIS — I10 Essential (primary) hypertension: Secondary | ICD-10-CM | POA: Diagnosis not present

## 2019-09-30 DIAGNOSIS — Z7984 Long term (current) use of oral hypoglycemic drugs: Secondary | ICD-10-CM | POA: Diagnosis not present

## 2019-09-30 DIAGNOSIS — K429 Umbilical hernia without obstruction or gangrene: Secondary | ICD-10-CM | POA: Diagnosis present

## 2019-09-30 DIAGNOSIS — Z720 Tobacco use: Secondary | ICD-10-CM | POA: Diagnosis not present

## 2019-09-30 DIAGNOSIS — D171 Benign lipomatous neoplasm of skin and subcutaneous tissue of trunk: Secondary | ICD-10-CM | POA: Diagnosis not present

## 2019-09-30 DIAGNOSIS — Z8261 Family history of arthritis: Secondary | ICD-10-CM | POA: Diagnosis not present

## 2019-09-30 DIAGNOSIS — Z8349 Family history of other endocrine, nutritional and metabolic diseases: Secondary | ICD-10-CM | POA: Diagnosis not present

## 2019-09-30 DIAGNOSIS — Z79899 Other long term (current) drug therapy: Secondary | ICD-10-CM | POA: Diagnosis not present

## 2019-09-30 DIAGNOSIS — G43909 Migraine, unspecified, not intractable, without status migrainosus: Secondary | ICD-10-CM | POA: Diagnosis not present

## 2019-09-30 DIAGNOSIS — Z833 Family history of diabetes mellitus: Secondary | ICD-10-CM | POA: Diagnosis not present

## 2019-09-30 DIAGNOSIS — E119 Type 2 diabetes mellitus without complications: Secondary | ICD-10-CM | POA: Diagnosis not present

## 2019-09-30 DIAGNOSIS — E7800 Pure hypercholesterolemia, unspecified: Secondary | ICD-10-CM | POA: Diagnosis not present

## 2019-09-30 DIAGNOSIS — Z820 Family history of epilepsy and other diseases of the nervous system: Secondary | ICD-10-CM | POA: Diagnosis not present

## 2019-09-30 DIAGNOSIS — Z8249 Family history of ischemic heart disease and other diseases of the circulatory system: Secondary | ICD-10-CM | POA: Diagnosis not present

## 2019-09-30 DIAGNOSIS — J45909 Unspecified asthma, uncomplicated: Secondary | ICD-10-CM | POA: Diagnosis not present

## 2019-09-30 DIAGNOSIS — Z01810 Encounter for preprocedural cardiovascular examination: Secondary | ICD-10-CM | POA: Insufficient documentation

## 2019-09-30 DIAGNOSIS — Z20822 Contact with and (suspected) exposure to covid-19: Secondary | ICD-10-CM

## 2019-09-30 DIAGNOSIS — F419 Anxiety disorder, unspecified: Secondary | ICD-10-CM | POA: Diagnosis not present

## 2019-09-30 DIAGNOSIS — K219 Gastro-esophageal reflux disease without esophagitis: Secondary | ICD-10-CM | POA: Diagnosis not present

## 2019-09-30 DIAGNOSIS — Z87442 Personal history of urinary calculi: Secondary | ICD-10-CM | POA: Diagnosis not present

## 2019-09-30 DIAGNOSIS — Z86711 Personal history of pulmonary embolism: Secondary | ICD-10-CM | POA: Diagnosis not present

## 2019-09-30 LAB — BASIC METABOLIC PANEL
Anion gap: 11 (ref 5–15)
BUN: 16 mg/dL (ref 6–20)
CO2: 23 mmol/L (ref 22–32)
Calcium: 9 mg/dL (ref 8.9–10.3)
Chloride: 97 mmol/L — ABNORMAL LOW (ref 98–111)
Creatinine, Ser: 0.91 mg/dL (ref 0.61–1.24)
GFR calc Af Amer: >60 mL/min (ref >60)
GFR calc non Af Amer: >60 mL/min (ref >60)
Glucose, Bld: 204 mg/dL — ABNORMAL HIGH (ref 70–99)
Potassium: 5.5 mmol/L — ABNORMAL HIGH (ref 3.5–5.1)
Sodium: 131 mmol/L — ABNORMAL LOW (ref 135–145)

## 2019-09-30 NOTE — Progress Notes (Signed)
      Enhanced Recovery after Surgery for Orthopedics Enhanced Recovery after Surgery is a protocol used to improve the stress on your body and your recovery after surgery.  Patient Instructions  . The night before surgery:  o No food after midnight. ONLY clear liquids after midnight  . The day of surgery (if you do NOT have diabetes):  o Drink ONE (1) Pre-Surgery Clear Ensure as directed.   o This drink was given to you during your hospital  pre-op appointment visit. o The pre-op nurse will instruct you on the time to drink the  Pre-Surgery Ensure depending on your surgery time. o Finish the drink at the designated time by the pre-op nurse.  o Nothing else to drink after completing the  Pre-Surgery Clear Ensure.  . The day of surgery (if you have diabetes): o Drink ONE (1) Gatorade 2 (G2) as directed. o This drink was given to you during your hospital  pre-op appointment visit.  o The pre-op nurse will instruct you on the time to drink the   Gatorade 2 (G2) depending on your surgery time. o Color of the Gatorade may vary. Red is not allowed. o Nothing else to drink after completing the  Gatorade 2 (G2).         If you have questions, please contact your surgeon's office.  Surgical scrub also given to patient with instructions for use.  Patient verbalized understanding of instructions.

## 2019-09-30 NOTE — Progress Notes (Signed)
Pt here today for pre-op labs and EKG.  Labs reviewed with Dr. Birdie Sons, will recheck DOS.  Pt called and message left to come in at 1145 DOS for lab recheck. Spoke with Hassan Rowan at Dr. Ethlyn Gallery office to let them know.

## 2019-10-01 LAB — NOVEL CORONAVIRUS, NAA: SARS-CoV-2, NAA: NOT DETECTED

## 2019-10-02 ENCOUNTER — Encounter (HOSPITAL_BASED_OUTPATIENT_CLINIC_OR_DEPARTMENT_OTHER)
Admission: RE | Disposition: A | Discharge status: Home / Self Care | Payer: Self-pay | Source: Home / Self Care | Attending: General Surgery

## 2019-10-02 ENCOUNTER — Encounter (HOSPITAL_BASED_OUTPATIENT_CLINIC_OR_DEPARTMENT_OTHER): Payer: Self-pay | Admitting: General Surgery

## 2019-10-02 ENCOUNTER — Ambulatory Visit (HOSPITAL_BASED_OUTPATIENT_CLINIC_OR_DEPARTMENT_OTHER)
Admission: RE | Admit: 2019-10-02 | Discharge: 2019-10-02 | Disposition: A | Discharge status: Home / Self Care | Payer: Managed Care, Other (non HMO) | Attending: General Surgery | Admitting: General Surgery

## 2019-10-02 ENCOUNTER — Ambulatory Visit (HOSPITAL_BASED_OUTPATIENT_CLINIC_OR_DEPARTMENT_OTHER): Payer: Managed Care, Other (non HMO) | Admitting: Anesthesiology

## 2019-10-02 ENCOUNTER — Other Ambulatory Visit: Payer: Self-pay

## 2019-10-02 DIAGNOSIS — Z79899 Other long term (current) drug therapy: Secondary | ICD-10-CM | POA: Insufficient documentation

## 2019-10-02 DIAGNOSIS — J45909 Unspecified asthma, uncomplicated: Secondary | ICD-10-CM | POA: Insufficient documentation

## 2019-10-02 DIAGNOSIS — Z7984 Long term (current) use of oral hypoglycemic drugs: Secondary | ICD-10-CM | POA: Insufficient documentation

## 2019-10-02 DIAGNOSIS — F419 Anxiety disorder, unspecified: Secondary | ICD-10-CM | POA: Insufficient documentation

## 2019-10-02 DIAGNOSIS — E7800 Pure hypercholesterolemia, unspecified: Secondary | ICD-10-CM | POA: Insufficient documentation

## 2019-10-02 DIAGNOSIS — K219 Gastro-esophageal reflux disease without esophagitis: Secondary | ICD-10-CM | POA: Insufficient documentation

## 2019-10-02 DIAGNOSIS — D171 Benign lipomatous neoplasm of skin and subcutaneous tissue of trunk: Secondary | ICD-10-CM | POA: Insufficient documentation

## 2019-10-02 DIAGNOSIS — E119 Type 2 diabetes mellitus without complications: Secondary | ICD-10-CM | POA: Insufficient documentation

## 2019-10-02 DIAGNOSIS — Z86711 Personal history of pulmonary embolism: Secondary | ICD-10-CM | POA: Insufficient documentation

## 2019-10-02 DIAGNOSIS — Z8349 Family history of other endocrine, nutritional and metabolic diseases: Secondary | ICD-10-CM | POA: Insufficient documentation

## 2019-10-02 DIAGNOSIS — I10 Essential (primary) hypertension: Secondary | ICD-10-CM | POA: Insufficient documentation

## 2019-10-02 DIAGNOSIS — Z87442 Personal history of urinary calculi: Secondary | ICD-10-CM | POA: Insufficient documentation

## 2019-10-02 DIAGNOSIS — Z720 Tobacco use: Secondary | ICD-10-CM | POA: Insufficient documentation

## 2019-10-02 DIAGNOSIS — K429 Umbilical hernia without obstruction or gangrene: Secondary | ICD-10-CM | POA: Diagnosis not present

## 2019-10-02 DIAGNOSIS — Z8261 Family history of arthritis: Secondary | ICD-10-CM | POA: Insufficient documentation

## 2019-10-02 DIAGNOSIS — Z8249 Family history of ischemic heart disease and other diseases of the circulatory system: Secondary | ICD-10-CM | POA: Insufficient documentation

## 2019-10-02 DIAGNOSIS — Z820 Family history of epilepsy and other diseases of the nervous system: Secondary | ICD-10-CM | POA: Insufficient documentation

## 2019-10-02 DIAGNOSIS — Z833 Family history of diabetes mellitus: Secondary | ICD-10-CM | POA: Insufficient documentation

## 2019-10-02 DIAGNOSIS — G43909 Migraine, unspecified, not intractable, without status migrainosus: Secondary | ICD-10-CM | POA: Insufficient documentation

## 2019-10-02 HISTORY — PX: LIPOMA EXCISION: SHX5283

## 2019-10-02 HISTORY — PX: UMBILICAL HERNIA REPAIR: SHX196

## 2019-10-02 LAB — BASIC METABOLIC PANEL
Anion gap: 12 (ref 5–15)
BUN: 27 mg/dL — ABNORMAL HIGH (ref 6–20)
CO2: 19 mmol/L — ABNORMAL LOW (ref 22–32)
Calcium: 9 mg/dL (ref 8.9–10.3)
Chloride: 101 mmol/L (ref 98–111)
Creatinine, Ser: 1.07 mg/dL (ref 0.61–1.24)
GFR calc Af Amer: >60 mL/min (ref >60)
GFR calc non Af Amer: >60 mL/min (ref >60)
Glucose, Bld: 174 mg/dL — ABNORMAL HIGH (ref 70–99)
Potassium: 5.1 mmol/L (ref 3.5–5.1)
Sodium: 132 mmol/L — ABNORMAL LOW (ref 135–145)

## 2019-10-02 LAB — GLUCOSE, CAPILLARY: Glucose-Capillary: 160 mg/dL — ABNORMAL HIGH (ref 70–99)

## 2019-10-02 SURGERY — REPAIR, HERNIA, UMBILICAL, ADULT
Anesthesia: General | Site: Chest

## 2019-10-02 MED ORDER — GABAPENTIN 300 MG PO CAPS
ORAL_CAPSULE | ORAL | Status: AC
  Filled 2019-10-02: qty 1

## 2019-10-02 MED ORDER — FENTANYL CITRATE (PF) 100 MCG/2ML IJ SOLN: 50.0000 ug | INTRAMUSCULAR | Status: DC | PRN

## 2019-10-02 MED ORDER — HYDROCODONE-ACETAMINOPHEN 5-325 MG PO TABS: 1.0000 | ORAL_TABLET | Freq: Four times a day (QID) | ORAL | 0 refills | Status: DC | PRN

## 2019-10-02 MED ORDER — DEXAMETHASONE SODIUM PHOSPHATE 4 MG/ML IJ SOLN
INTRAMUSCULAR | Status: DC | PRN
  Administered 2019-10-02: 5 mg via INTRAVENOUS

## 2019-10-02 MED ORDER — CEFAZOLIN SODIUM-DEXTROSE 2-4 GM/100ML-% IV SOLN
INTRAVENOUS | Status: AC
  Filled 2019-10-02: qty 100

## 2019-10-02 MED ORDER — ONDANSETRON HCL 4 MG/2ML IJ SOLN
INTRAMUSCULAR | Status: AC
  Filled 2019-10-02: qty 2

## 2019-10-02 MED ORDER — LIDOCAINE 2% (20 MG/ML) 5 ML SYRINGE
INTRAMUSCULAR | Status: AC
  Filled 2019-10-02: qty 5

## 2019-10-02 MED ORDER — PHENYLEPHRINE 40 MCG/ML (10ML) SYRINGE FOR IV PUSH (FOR BLOOD PRESSURE SUPPORT)
PREFILLED_SYRINGE | INTRAVENOUS | Status: AC
  Filled 2019-10-02: qty 10

## 2019-10-02 MED ORDER — MIDAZOLAM HCL 5 MG/5ML IJ SOLN
INTRAMUSCULAR | Status: DC | PRN
  Administered 2019-10-02: 2 mg via INTRAVENOUS

## 2019-10-02 MED ORDER — LACTATED RINGERS IV SOLN: INTRAVENOUS | Status: DC

## 2019-10-02 MED ORDER — CHLORHEXIDINE GLUCONATE CLOTH 2 % EX PADS: 6.0000 | MEDICATED_PAD | Freq: Once | CUTANEOUS | Status: DC

## 2019-10-02 MED ORDER — MIDAZOLAM HCL 2 MG/2ML IJ SOLN: 1.0000 mg | INTRAMUSCULAR | Status: DC | PRN

## 2019-10-02 MED ORDER — CELECOXIB 200 MG PO CAPS
200.0000 mg | ORAL_CAPSULE | ORAL | Status: AC
  Administered 2019-10-02: 200 mg via ORAL

## 2019-10-02 MED ORDER — ONDANSETRON HCL 4 MG/2ML IJ SOLN
INTRAMUSCULAR | Status: DC | PRN
  Administered 2019-10-02: 4 mg via INTRAVENOUS

## 2019-10-02 MED ORDER — EPHEDRINE 5 MG/ML INJ
INTRAVENOUS | Status: AC
  Filled 2019-10-02: qty 10

## 2019-10-02 MED ORDER — ROCURONIUM BROMIDE 100 MG/10ML IV SOLN
INTRAVENOUS | Status: DC | PRN
  Administered 2019-10-02: 50 mg via INTRAVENOUS
  Administered 2019-10-02: 10 mg via INTRAVENOUS

## 2019-10-02 MED ORDER — ACETAMINOPHEN 500 MG PO TABS
ORAL_TABLET | ORAL | Status: AC
  Filled 2019-10-02: qty 2

## 2019-10-02 MED ORDER — SUCCINYLCHOLINE CHLORIDE 200 MG/10ML IV SOSY
PREFILLED_SYRINGE | INTRAVENOUS | Status: AC
  Filled 2019-10-02: qty 10

## 2019-10-02 MED ORDER — MIDAZOLAM HCL 2 MG/2ML IJ SOLN
INTRAMUSCULAR | Status: AC
  Filled 2019-10-02: qty 2

## 2019-10-02 MED ORDER — ACETAMINOPHEN 500 MG PO TABS
1000.0000 mg | ORAL_TABLET | ORAL | Status: AC
  Administered 2019-10-02: 1000 mg via ORAL

## 2019-10-02 MED ORDER — CEFAZOLIN SODIUM-DEXTROSE 2-4 GM/100ML-% IV SOLN
2.0000 g | INTRAVENOUS | Status: AC
  Administered 2019-10-02: 2 g via INTRAVENOUS

## 2019-10-02 MED ORDER — FENTANYL CITRATE (PF) 100 MCG/2ML IJ SOLN
25.0000 ug | INTRAMUSCULAR | Status: DC | PRN
  Administered 2019-10-02 (×2): 50 ug via INTRAVENOUS

## 2019-10-02 MED ORDER — CELECOXIB 200 MG PO CAPS
ORAL_CAPSULE | ORAL | Status: AC
  Filled 2019-10-02: qty 1

## 2019-10-02 MED ORDER — GABAPENTIN 300 MG PO CAPS
300.0000 mg | ORAL_CAPSULE | ORAL | Status: AC
  Administered 2019-10-02: 300 mg via ORAL

## 2019-10-02 MED ORDER — SUGAMMADEX SODIUM 500 MG/5ML IV SOLN
INTRAVENOUS | Status: AC
  Filled 2019-10-02: qty 5

## 2019-10-02 MED ORDER — OXYCODONE HCL 5 MG/5ML PO SOLN: 5.0000 mg | Freq: Once | ORAL | Status: DC | PRN

## 2019-10-02 MED ORDER — SUGAMMADEX SODIUM 200 MG/2ML IV SOLN
INTRAVENOUS | Status: DC | PRN
  Administered 2019-10-02: 200 mg via INTRAVENOUS

## 2019-10-02 MED ORDER — LIDOCAINE HCL (CARDIAC) PF 100 MG/5ML IV SOSY
PREFILLED_SYRINGE | INTRAVENOUS | Status: DC | PRN
  Administered 2019-10-02: 80 mg via INTRAVENOUS

## 2019-10-02 MED ORDER — FENTANYL CITRATE (PF) 100 MCG/2ML IJ SOLN
INTRAMUSCULAR | Status: DC | PRN
  Administered 2019-10-02: 50 ug via INTRAVENOUS

## 2019-10-02 MED ORDER — PROMETHAZINE HCL 25 MG/ML IJ SOLN: 6.2500 mg | INTRAMUSCULAR | Status: DC | PRN

## 2019-10-02 MED ORDER — BUPIVACAINE HCL (PF) 0.25 % IJ SOLN
INTRAMUSCULAR | Status: DC | PRN
  Administered 2019-10-02: 19 mL

## 2019-10-02 MED ORDER — PROPOFOL 500 MG/50ML IV EMUL
INTRAVENOUS | Status: AC
  Filled 2019-10-02: qty 50

## 2019-10-02 MED ORDER — OXYCODONE HCL 5 MG PO TABS: 5.0000 mg | ORAL_TABLET | Freq: Once | ORAL | Status: DC | PRN

## 2019-10-02 MED ORDER — PROPOFOL 10 MG/ML IV BOLUS
INTRAVENOUS | Status: DC | PRN
  Administered 2019-10-02: 200 mg via INTRAVENOUS

## 2019-10-02 MED ORDER — FENTANYL CITRATE (PF) 100 MCG/2ML IJ SOLN
INTRAMUSCULAR | Status: AC
  Filled 2019-10-02: qty 2

## 2019-10-02 SURGICAL SUPPLY — 62 items
BENZOIN TINCTURE PRP APPL 2/3 (GAUZE/BANDAGES/DRESSINGS) IMPLANT
BIOPATCH RED 1 DISK 7.0 (GAUZE/BANDAGES/DRESSINGS) ×3 IMPLANT
BIOPATCH RED 1IN DISK 7.0MM (GAUZE/BANDAGES/DRESSINGS) ×1
BLADE CLIPPER SURG (BLADE) IMPLANT
BLADE SURG 10 STRL SS (BLADE) ×4 IMPLANT
BLADE SURG 15 STRL LF DISP TIS (BLADE) ×2 IMPLANT
BLADE SURG 15 STRL SS (BLADE) ×2
CANISTER SUCT 1200ML W/VALVE (MISCELLANEOUS) IMPLANT
CHLORAPREP W/TINT 26 (MISCELLANEOUS) ×8 IMPLANT
CLOSURE WOUND 1/2 X4 (GAUZE/BANDAGES/DRESSINGS)
COVER BACK TABLE 60X90IN (DRAPES) ×4 IMPLANT
COVER MAYO STAND STRL (DRAPES) ×4 IMPLANT
COVER WAND RF STERILE (DRAPES) IMPLANT
DECANTER SPIKE VIAL GLASS SM (MISCELLANEOUS) ×4 IMPLANT
DERMABOND ADVANCED (GAUZE/BANDAGES/DRESSINGS) ×2
DERMABOND ADVANCED .7 DNX12 (GAUZE/BANDAGES/DRESSINGS) ×2 IMPLANT
DRAIN CHANNEL 10M FLAT 3/4 FLT (DRAIN) ×4 IMPLANT
DRAPE LAPAROTOMY 100X72 PEDS (DRAPES) ×4 IMPLANT
DRAPE LAPAROTOMY TRNSV 102X78 (DRAPES) ×4 IMPLANT
DRAPE UTILITY XL STRL (DRAPES) ×4 IMPLANT
DRSG TEGADERM 4X4.75 (GAUZE/BANDAGES/DRESSINGS) IMPLANT
ELECT COATED BLADE 2.86 ST (ELECTRODE) ×4 IMPLANT
ELECT REM PT RETURN 9FT ADLT (ELECTROSURGICAL) ×4
ELECTRODE REM PT RTRN 9FT ADLT (ELECTROSURGICAL) ×2 IMPLANT
GAUZE 4X4 16PLY RFD (DISPOSABLE) IMPLANT
GAUZE SPONGE 4X4 12PLY STRL LF (GAUZE/BANDAGES/DRESSINGS) IMPLANT
GLOVE BIO SURGEON STRL SZ7.5 (GLOVE) ×8 IMPLANT
GOWN STRL REUS W/ TWL LRG LVL3 (GOWN DISPOSABLE) ×4 IMPLANT
GOWN STRL REUS W/TWL LRG LVL3 (GOWN DISPOSABLE) ×4
MESH VENTRALEX ST 2.5 CRC MED (Mesh General) ×4 IMPLANT
NEEDLE HYPO 22GX1.5 SAFETY (NEEDLE) IMPLANT
NEEDLE HYPO 25X1 1.5 SAFETY (NEEDLE) ×4 IMPLANT
NS IRRIG 1000ML POUR BTL (IV SOLUTION) ×4 IMPLANT
PACK BASIN DAY SURGERY FS (CUSTOM PROCEDURE TRAY) ×4 IMPLANT
PENCIL SMOKE EVACUATOR (MISCELLANEOUS) ×4 IMPLANT
SLEEVE SCD COMPRESS KNEE MED (MISCELLANEOUS) ×4 IMPLANT
SPONGE LAP 18X18 RF (DISPOSABLE) ×4 IMPLANT
STRIP CLOSURE SKIN 1/2X4 (GAUZE/BANDAGES/DRESSINGS) IMPLANT
SUT CHROMIC 3 0 SH 27 (SUTURE) IMPLANT
SUT ETHILON 3 0 PS 1 (SUTURE) ×4 IMPLANT
SUT MON AB 4-0 PC3 18 (SUTURE) ×8 IMPLANT
SUT NOVA NAB DX-16 0-1 5-0 T12 (SUTURE) ×4 IMPLANT
SUT NOVA NAB GS-21 1 T12 (SUTURE) IMPLANT
SUT PROLENE 3 0 PS 2 (SUTURE) IMPLANT
SUT SILK 2 0 PERMA HAND 18 BK (SUTURE) IMPLANT
SUT SILK 3 0 SH 30 (SUTURE) IMPLANT
SUT VIC AB 2-0 SH 27 (SUTURE) ×2
SUT VIC AB 2-0 SH 27XBRD (SUTURE) ×2 IMPLANT
SUT VIC AB 3-0 54X BRD REEL (SUTURE) IMPLANT
SUT VIC AB 3-0 BRD 54 (SUTURE)
SUT VIC AB 3-0 FS2 27 (SUTURE) IMPLANT
SUT VIC AB 3-0 SH 27 (SUTURE)
SUT VIC AB 3-0 SH 27X BRD (SUTURE) IMPLANT
SUT VIC AB 4-0 RB1 27 (SUTURE)
SUT VIC AB 4-0 RB1 27X BRD (SUTURE) IMPLANT
SUT VICRYL 4-0 PS2 18IN ABS (SUTURE) ×8 IMPLANT
SUT VICRYL AB 3 0 TIES (SUTURE) IMPLANT
SYR CONTROL 10ML LL (SYRINGE) ×4 IMPLANT
TOWEL GREEN STERILE FF (TOWEL DISPOSABLE) ×8 IMPLANT
TUBE CONNECTING 20'X1/4 (TUBING) ×1
TUBE CONNECTING 20X1/4 (TUBING) ×3 IMPLANT
YANKAUER SUCT BULB TIP NO VENT (SUCTIONS) ×4 IMPLANT

## 2019-10-02 NOTE — Discharge Instructions (Signed)
Next dose of Tylenol or Ibuprofen can be taken at 630pm today.  Post Anesthesia Home Care Instructions  Activity: Get plenty of rest for the remainder of the day. A responsible individual must stay with you for 24 hours following the procedure.  For the next 24 hours, DO NOT: -Drive a car -Paediatric nurse -Drink alcoholic beverages -Take any medication unless instructed by your physician -Make any legal decisions or sign important papers.  Meals: Start with liquid foods such as gelatin or soup. Progress to regular foods as tolerated. Avoid greasy, spicy, heavy foods. If nausea and/or vomiting occur, drink only clear liquids until the nausea and/or vomiting subsides. Call your physician if vomiting continues.  Special Instructions/Symptoms: Your throat may feel dry or sore from the anesthesia or the breathing tube placed in your throat during surgery. If this causes discomfort, gargle with warm salt water. The discomfort should disappear within 24 hours.  If you had a scopolamine patch placed behind your ear for the management of post- operative nausea and/or vomiting:  1. The medication in the patch is effective for 72 hours, after which it should be removed.  Wrap patch in a tissue and discard in the trash. Wash hands thoroughly with soap and water. 2. You may remove the patch earlier than 72 hours if you experience unpleasant side effects which may include dry mouth, dizziness or visual disturbances. 3. Avoid touching the patch. Wash your hands with soap and water after contact with the patch.  About my Jackson-Pratt Bulb Drain  What is a Jackson-Pratt bulb? A Jackson-Pratt is a soft, round device used to collect drainage. It is connected to a long, thin drainage catheter, which is held in place by one or two small stiches near your surgical incision site. When the bulb is squeezed, it forms a vacuum, forcing the drainage to empty into the bulb.  Emptying the Jackson-Pratt bulb- To  empty the bulb: 1. Release the plug on the top of the bulb. 2. Pour the bulb's contents into a measuring container which your nurse will provide. 3. Record the time emptied and amount of drainage. Empty the drain(s) as often as your     doctor or nurse recommends.  Date                  Time                    Amount (Drain 1)                 Amount (Drain 2)  _____________________________________________________________________  _____________________________________________________________________  _____________________________________________________________________  _____________________________________________________________________  _____________________________________________________________________  _____________________________________________________________________  _____________________________________________________________________  _____________________________________________________________________  Squeezing the Jackson-Pratt Bulb- To squeeze the bulb: 1. Make sure the plug at the top of the bulb is open. 2. Squeeze the bulb tightly in your fist. You will hear air squeezing from the bulb. 3. Replace the plug while the bulb is squeezed. 4. Use a safety pin to attach the bulb to your clothing. This will keep the catheter from     pulling at the bulb insertion site.  When to call your doctor- Call your doctor if:  Drain site becomes red, swollen or hot.  You have a fever greater than 101 degrees F.  There is oozing at the drain site.  Drain falls out (apply a guaze bandage over the drain hole and secure it with tape).  Drainage increases daily not related to activity patterns. (You will usually have more drainage when you  are active than when you are resting.)  Drainage has a bad odor.

## 2019-10-02 NOTE — Addendum Note (Signed)
Addendum  created 10/02/19 1659 by Willa Frater, CRNA   Charge Capture section accepted

## 2019-10-02 NOTE — Anesthesia Preprocedure Evaluation (Addendum)
Anesthesia Evaluation  Patient identified by MRN, date of birth, ID band Patient awake    Reviewed: Allergy & Precautions, NPO status , Patient's Chart, lab work & pertinent test results  History of Anesthesia Complications Negative for: history of anesthetic complications  Airway Mallampati: II  TM Distance: >3 FB Neck ROM: Full    Dental no notable dental hx.    Pulmonary asthma , Current Smoker and Patient abstained from smoking.,    Pulmonary exam normal        Cardiovascular hypertension, Pt. on medications Normal cardiovascular exam     Neuro/Psych Anxiety negative neurological ROS     GI/Hepatic negative GI ROS, Neg liver ROS,   Endo/Other  diabetes, Type 2, Oral Hypoglycemic Agents  Renal/GU negative Renal ROS  negative genitourinary   Musculoskeletal negative musculoskeletal ROS (+)   Abdominal   Peds  Hematology negative hematology ROS (+)   Anesthesia Other Findings Day of surgery medications reviewed with patient.  Reproductive/Obstetrics negative OB ROS                            Anesthesia Physical Anesthesia Plan  ASA: II  Anesthesia Plan: General   Post-op Pain Management:    Induction: Intravenous  PONV Risk Score and Plan: Treatment may vary due to age or medical condition, Ondansetron, Dexamethasone and Midazolam  Airway Management Planned: Oral ETT  Additional Equipment: None  Intra-op Plan:   Post-operative Plan: Extubation in OR  Informed Consent: I have reviewed the patients History and Physical, chart, labs and discussed the procedure including the risks, benefits and alternatives for the proposed anesthesia with the patient or authorized representative who has indicated his/her understanding and acceptance.     Dental advisory given  Plan Discussed with: CRNA  Anesthesia Plan Comments:        Anesthesia Quick Evaluation

## 2019-10-02 NOTE — Anesthesia Postprocedure Evaluation (Signed)
Anesthesia Post Note  Patient: MOUHAMED MERTENS  Procedure(s) Performed: UMBILICAL HERNIA REPAIR WITH MESH (N/A Abdomen) EXCISION LIPOMA OF LEFT CHEST WALL (Left Chest)     Patient location during evaluation: PACU Anesthesia Type: General Level of consciousness: awake and alert and oriented Pain management: pain level controlled Vital Signs Assessment: post-procedure vital signs reviewed and stable Respiratory status: spontaneous breathing, nonlabored ventilation and respiratory function stable Cardiovascular status: blood pressure returned to baseline Postop Assessment: no apparent nausea or vomiting Anesthetic complications: no    Last Vitals:  Vitals:   10/02/19 1615 10/02/19 1639  BP: (!) 169/87 (!) 167/99  Pulse: 94 98  Resp: (!) 8   Temp:  36.4 C  SpO2: 98% 97%    Last Pain:  Vitals:   10/02/19 1639  TempSrc: Oral  PainSc:                  Brennan Bailey

## 2019-10-02 NOTE — Transfer of Care (Signed)
Immediate Anesthesia Transfer of Care Note  Patient: Bradley Henson  Procedure(s) Performed: UMBILICAL HERNIA REPAIR WITH MESH (N/A Abdomen) EXCISION LIPOMA OF LEFT CHEST WALL (Left Chest)  Patient Location: PACU  Anesthesia Type:General  Level of Consciousness: awake, alert , oriented and drowsy  Airway & Oxygen Therapy: Patient Spontanous Breathing and Patient connected to face mask oxygen  Post-op Assessment: Report given to RN and Post -op Vital signs reviewed and stable  Post vital signs: Reviewed and stable  Last Vitals:  Vitals Value Taken Time  BP    Temp    Pulse 99 10/02/19 1533  Resp 17 10/02/19 1533  SpO2 97 % 10/02/19 1533  Vitals shown include unvalidated device data.  Last Pain:  Vitals:   10/02/19 0922  TempSrc: Oral  PainSc: 0-No pain      Patients Stated Pain Goal: 9 ([id number])  Complications: No apparent anesthesia complications

## 2019-10-02 NOTE — Interval H&P Note (Signed)
History and Physical Interval Note:  10/02/2019 1:28 PM  Bradley Henson  has presented today for surgery, with the diagnosis of UMBILICAL HERNIA, LIPOMA L CHEST WALL.  The various methods of treatment have been discussed with the patient and family. After consideration of risks, benefits and other options for treatment, the patient has consented to  Procedure(s): UMBILICAL HERNIA REPAIR WITH MESH (N/A) EXCISION LIPOMA OF LEFT CHEST WALL (Left) as a surgical intervention.  The patient's history has been reviewed, patient examined, no change in status, stable for surgery.  I have reviewed the patient's chart and labs.  Questions were answered to the patient's satisfaction.     Autumn Messing III

## 2019-10-02 NOTE — Op Note (Signed)
10/02/2019  3:22 PM  PATIENT:  Bradley Henson  54 y.o. male  PRE-OPERATIVE DIAGNOSIS:  UMBILICAL HERNIA, LIPOMA L CHEST WALL  POST-OPERATIVE DIAGNOSIS:  UMBILICAL HERNIA, LIPOMA L CHEST WALL  PROCEDURE:  Procedure(s): UMBILICAL HERNIA REPAIR WITH MESH (N/A) EXCISION LIPOMA OF LEFT CHEST WALL (Left)  SURGEON:  Surgeon(s) and Role:    * Jovita Kussmaul, MD - Primary  PHYSICIAN ASSISTANT:   ASSISTANTS: none   ANESTHESIA:   local and general  EBL:  3 mL   BLOOD ADMINISTERED:none  DRAINS: (1) Jackson-Pratt drain(s) with closed bulb suction in the prepectoral space   LOCAL MEDICATIONS USED:  MARCAINE     SPECIMEN:  Source of Specimen:  lipoma left chest wall  DISPOSITION OF SPECIMEN:  PATHOLOGY  COUNTS:  YES  TOURNIQUET:  * No tourniquets in log *  DICTATION: .Dragon Dictation   After informed consent was obtained the patient was brought to the operating room and placed in the supine position on the operating table.  After adequate induction of general anesthesia the patient's abdomen and left chest were prepped with ChloraPrep, allowed to dry, and draped in usual sterile manner.  An appropriate timeout was performed.  Attention was first turned to the umbilical hernia.  The area around the umbilicus was infiltrated with quarter percent Marcaine.  A vertically oriented elliptical incision was made overlying the hernia in order to remove some of the ischemic-looking skin.  The incision was carried through the skin and subcutaneous tissue sharply with electrocautery.  The hernia sac was opened sharply also with the electrocautery.  There was only omental fat within the hernia.  This was freed from the hernia sac and then reduced easily.  The hernia sac was excised sharply with the electrocautery.  A 6.4 cm piece of umbilical hernia repair mesh was chosen.  The fascial edges appeared strong and healthy.  The mesh was placed through the fascial defect and then the anchors were used to pull  the mesh up against the abdominal wall.  The fascial defect was then closed with interrupted #1 Novafil stitches incorporating the anchors of the mesh.  Once this was accomplished the mesh appeared to be in good position and the hernia seem well repaired.  The subcutaneous tissue was irrigated with copious amounts of saline.  The deep layer of the wound was then closed with interrupted 2-0 Vicryl stitches.  The skin was closed with a running 4-0 Monocryl subcuticular stitch.  Dermabond dressings were applied.  Attention was then turned to the left chest wall.  The area around this large fatty feeling mass was infiltrated with quarter percent Marcaine.  A transversely oriented incision was made from one edge of the fatty mass to the other with a 15 blade knife.  The incision was carried through the skin and subcutaneous tissue sharply with the electrocautery until the fatty mass was encountered.  The fatty mass was then separated from the rest of the subcutaneous fat by combination of blunt hemostat dissection and some sharp dissection with the electrocautery.  The fatty mass seem to be well circumscribed and easy to separate from the rest of the tissue.  Once the entire mass was removed it measured 11 x 8 cm.  It appeared to be sitting on top of the pectoralis muscle.  This was sent to pathology for further evaluation.  The cavity was fairly large and I think the risk for seroma was significant.  Because of this I made a small stab incision  in the lateral chest wall with a 15 blade knife.  A hemostat was then placed through this opening and used to bring a small 10 [place] flat drain into the operative bed.  The drain was anchored to the skin with a 3-0 nylon stitch.  The subcutaneous tissue was then closed over the drain in chest wall with interrupted 3-0 Vicryl stitches.  The skin was then closed with a running 4-0 Monocryl subcuticular stitch.  Dermabond dressings were applied.  The patient tolerated the procedure  well.  At the end of the case all needle sponge and instrument counts were correct.  The drain was placed to bulb suction and there was a good seal.  The patient was then awakened and taken to recovery in stable condition.  PLAN OF CARE: Discharge to home after PACU  PATIENT DISPOSITION:  PACU - hemodynamically stable.   Delay start of Pharmacological VTE agent (>24hrs) due to surgical blood loss or risk of bleeding: not applicable

## 2019-10-02 NOTE — Anesthesia Procedure Notes (Signed)
Procedure Name: Intubation Date/Time: 10/02/2019 1:59 PM Performed by: Willa Frater, CRNA Pre-anesthesia Checklist: Patient identified, Emergency Drugs available, Suction available and Patient being monitored Patient Re-evaluated:Patient Re-evaluated prior to induction Oxygen Delivery Method: Circle system utilized Preoxygenation: Pre-oxygenation with 100% oxygen Induction Type: IV induction Ventilation: Mask ventilation without difficulty Laryngoscope Size: Miller and 2 Grade View: Grade II Tube type: Oral Tube size: 8.0 mm Number of attempts: 1 Airway Equipment and Method: Stylet and Oral airway Placement Confirmation: ETT inserted through vocal cords under direct vision,  positive ETCO2 and breath sounds checked- equal and bilateral Secured at: 23 cm Tube secured with: Tape Dental Injury: Teeth and Oropharynx as per pre-operative assessment

## 2019-10-02 NOTE — H&P (Signed)
Bradley Henson  Location: [place] [facility] Patient #: [id number] DOB: 1966/01/24 Married / Language: English / Race: White Male   History of Present Illness  The patient is a 54 year old male who presents for a follow-up for Abdominal pain. the patient is a 54 year old white male who was originally seen by our practice 2 years ago for an umbilical hernia and a large lipoma of the left chest wall. He was unable to schedule surgery at that time. He would now like to schedule his definitive surgery. He does complain of some discomfort associated with the bellybutton. The mass on the left chest wall has continued to get larger and gets in the way of his daily activities.   Problem List/Past Medical Acid Reflux / GERD  Hernia (umbilical)  High blood pressure  Diabetes  High Cholesterol  Kidney Stone  Migraines  Pulmonary Embolism / Blood Clot in Legs  UMBILICAL HERNIA WITHOUT OBSTRUCTION AND WITHOUT GANGRENE (K42.9)  LIPOMA OF CHEST WALL (D17.1)   Past Surgical History  Open Inguinal Hernia Surgery - Left   Allergies  No Known Drug Allergies   Medication History  AmLODIPine Besylate (5MG  Tablet, Oral) Active. Trulicity (1.5MG /0.5ML Soln Pen-inj, Subcutaneous) Active. GlipiZIDE ER (5MG  Tablet ER 24HR, Oral) Active. Irbesartan (300MG  Tablet, Oral) Active. MetFORMIN HCl (1000MG  Tablet, Oral) Active. Rosuvastatin Calcium (20MG  Tablet, Oral) Active. Medications Reconciled  Social History  Current tobacco use  No alcohol use  Caffeine use  Carbonated beverages. No drug use   Family History  Alcohol Abuse  Father, Mother, Brother. Arthritis  Mother. Diabetes Mellitus  Father. High Blood Pressure / Hypertension  Father, Mother, Brother. Migraine Headache  Mother, Brother. Thyroid problems  Mother.    Review of Systems General Not Present- Appetite Loss, Chills, Fatigue, Fever, Night Sweats, Weight Gain and Weight Loss. Note: All other systems  negative (unless as noted in HPI & included Review of Systems) Skin Not Present- Change in Wart/Mole, Dryness, Hives, Jaundice, New Lesions, Non-Healing Wounds, Rash and Ulcer. HEENT Not Present- Earache, Hearing Loss, Hoarseness, Nose Bleed, Oral Ulcers, Ringing in the Ears, Seasonal Allergies, Sinus Pain, Sore Throat, Visual Disturbances, Wears glasses/contact lenses and Yellow Eyes. Respiratory Not Present- Bloody sputum, Chronic Cough, Difficulty Breathing, Snoring and Wheezing. Breast Not Present- Breast Mass, Breast Pain, Nipple Discharge and Skin Changes. Cardiovascular Not Present- Chest Pain, Difficulty Breathing Lying Down, Leg Cramps, Palpitations, Rapid Heart Rate, Shortness of Breath and Swelling of Extremities. Gastrointestinal Not Present- Abdominal Pain, Bloating, Bloody Stool, Change in Bowel Habits, Chronic diarrhea, Constipation, Difficulty Swallowing, Excessive gas, Gets full quickly at meals, Hemorrhoids, Indigestion, Nausea, Rectal Pain and Vomiting. Male Genitourinary Present- Excessive Urination at Night. Not Present- Blood in Urine, Change in Urinary Stream, Frequency, Impotence, Nocturia, Painful Urination, Urgency and Urine Leakage. Musculoskeletal Not Present- Back Pain, Joint Pain, Joint Stiffness, Muscle Pain, Muscle Weakness and Swelling of Extremities. Neurological Not Present- Decreased Memory, Fainting, Headaches, Numbness, Seizures, Tingling, Tremor, Trouble walking and Weakness. Psychiatric Not Present- Anxiety, Bipolar, Change in Sleep Pattern, Depression, Fearful and Frequent crying. Endocrine Not Present- Cold Intolerance, Excessive Hunger, Hair Changes, Heat Intolerance and New Diabetes. Hematology Not Present- Easy Bruising, Excessive bleeding, Gland problems, HIV and Persistent Infections.  Vitals  Weight: 211.13 lb Height: 72in Body Surface Area: 2.18 m Body Mass Index: 28.63 kg/m  Pulse: 68 (Regular)        Physical Exam General Mental  Status-Alert. General Appearance-Consistent with stated age. Hydration-Well hydrated. Voice-Normal.  Integumentary Note: 8 C X 8 CM FATTY  MOBILE MASS LEFT UPPER CHEST NOT FIXED   Chest and Lung Exam Chest and lung exam reveals -quiet, even and easy respiratory effort with no use of accessory muscles and on auscultation, normal breath sounds, no adventitious sounds and normal vocal resonance. Inspection Chest Wall - Normal. Back - normal.  Cardiovascular Cardiovascular examination reveals -normal heart sounds, regular rate and rhythm with no murmurs and normal pedal pulses bilaterally.  Abdomen Note: large reducible umbilical hernia  rectus diastasis   Neurologic Neurologic evaluation reveals -alert and oriented x 3 with no impairment of recent or remote memory. Mental Status-Normal.  Musculoskeletal Normal Exam - Left-Upper Extremity Strength Normal and Lower Extremity Strength Normal. Normal Exam - Right-Upper Extremity Strength Normal and Lower Extremity Strength Normal.    Assessment & Plan  LIPOMA OF CHEST WALL (123XX123) UMBILICAL HERNIA WITHOUT OBSTRUCTION AND WITHOUT GANGRENE (K42.9) Impression: the patient appears to have a large lipoma of the left chest wall as well as a moderate sized reducible umbilical hernia that is symptomatic.because of the risk of incarceration and strangulation at the given benefit from having the hernia fixed. He would also like to have this done. I also think given the size of the lipoma that it should also be removed and again he agrees. I have discussed with him in detail the risks and benefits of the operation as well as some of the technical aspects including the use of mesh for the hernia repair and he understands and wishes to proceed.

## 2019-10-05 LAB — SURGICAL PATHOLOGY

## 2019-10-06 ENCOUNTER — Encounter: Payer: Self-pay | Admitting: *Deleted

## 2019-10-30 ENCOUNTER — Encounter: Payer: Self-pay | Admitting: Internal Medicine

## 2019-10-30 ENCOUNTER — Ambulatory Visit: Payer: Managed Care, Other (non HMO) | Admitting: Internal Medicine

## 2019-10-30 ENCOUNTER — Other Ambulatory Visit: Payer: Self-pay

## 2019-10-30 VITALS — BP 136/74 | HR 68 | Temp 97.9°F | Ht 72.0 in | Wt 209.0 lb

## 2019-10-30 DIAGNOSIS — E538 Deficiency of other specified B group vitamins: Secondary | ICD-10-CM

## 2019-10-30 DIAGNOSIS — J4531 Mild persistent asthma with (acute) exacerbation: Secondary | ICD-10-CM

## 2019-10-30 DIAGNOSIS — Z114 Encounter for screening for human immunodeficiency virus [HIV]: Secondary | ICD-10-CM

## 2019-10-30 DIAGNOSIS — Z0000 Encounter for general adult medical examination without abnormal findings: Secondary | ICD-10-CM | POA: Diagnosis not present

## 2019-10-30 DIAGNOSIS — E119 Type 2 diabetes mellitus without complications: Secondary | ICD-10-CM | POA: Diagnosis not present

## 2019-10-30 DIAGNOSIS — E559 Vitamin D deficiency, unspecified: Secondary | ICD-10-CM

## 2019-10-30 DIAGNOSIS — K429 Umbilical hernia without obstruction or gangrene: Secondary | ICD-10-CM | POA: Diagnosis not present

## 2019-10-30 DIAGNOSIS — F17200 Nicotine dependence, unspecified, uncomplicated: Secondary | ICD-10-CM

## 2019-10-30 LAB — HEPATIC FUNCTION PANEL
ALT: 30 U/L (ref 0–53)
AST: 18 U/L (ref 0–37)
Albumin: 4.4 g/dL (ref 3.5–5.2)
Alkaline Phosphatase: 85 U/L (ref 39–117)
Bilirubin, Direct: 0.1 mg/dL (ref 0.0–0.3)
Total Bilirubin: 0.3 mg/dL (ref 0.2–1.2)
Total Protein: 7.5 g/dL (ref 6.0–8.3)

## 2019-10-30 LAB — MICROALBUMIN / CREATININE URINE RATIO
Creatinine,U: 70.4 mg/dL
Microalb Creat Ratio: 1 mg/g (ref 0.0–30.0)
Microalb, Ur: 0.7 mg/dL (ref 0.0–1.9)

## 2019-10-30 LAB — BASIC METABOLIC PANEL
BUN: 19 mg/dL (ref 6–23)
CO2: 22 mEq/L (ref 19–32)
Calcium: 10 mg/dL (ref 8.4–10.5)
Chloride: 102 mEq/L (ref 96–112)
Creatinine, Ser: 0.93 mg/dL (ref 0.40–1.50)
GFR: 85.06 mL/min (ref >60.00)
Glucose, Bld: 193 mg/dL — ABNORMAL HIGH (ref 70–99)
Potassium: 4.6 mEq/L (ref 3.5–5.1)
Sodium: 136 mEq/L (ref 135–145)

## 2019-10-30 LAB — CBC WITH DIFFERENTIAL/PLATELET
Basophils Absolute: 0.1 10*3/uL (ref 0.0–0.1)
Basophils Relative: 0.6 % (ref 0.0–3.0)
Eosinophils Absolute: 0.1 10*3/uL (ref 0.0–0.7)
Eosinophils Relative: 0.9 % (ref 0.0–5.0)
HCT: 48.5 % (ref 39.0–52.0)
Hemoglobin: 16.5 g/dL (ref 13.0–17.0)
Lymphocytes Relative: 28.6 % (ref 12.0–46.0)
Lymphs Abs: 2.7 10*3/uL (ref 0.7–4.0)
MCHC: 34.1 g/dL (ref 30.0–36.0)
MCV: 92.3 fl (ref 78.0–100.0)
Monocytes Absolute: 1 10*3/uL (ref 0.1–1.0)
Monocytes Relative: 10.2 % (ref 3.0–12.0)
Neutro Abs: 5.6 10*3/uL (ref 1.4–7.7)
Neutrophils Relative %: 59.7 % (ref 43.0–77.0)
Platelets: 317 10*3/uL (ref 150.0–400.0)
RBC: 5.26 Mil/uL (ref 4.22–5.81)
RDW: 13.3 % (ref 11.5–15.5)
WBC: 9.4 10*3/uL (ref 4.0–10.5)

## 2019-10-30 LAB — URINALYSIS, ROUTINE W REFLEX MICROSCOPIC
Bilirubin Urine: NEGATIVE
Hgb urine dipstick: NEGATIVE
Ketones, ur: NEGATIVE
Leukocytes,Ua: NEGATIVE
Nitrite: NEGATIVE
Specific Gravity, Urine: 1.015 (ref 1.000–1.030)
Total Protein, Urine: NEGATIVE
Urine Glucose: >=1000 — AB
Urobilinogen, UA: 0.2 (ref 0.0–1.0)
pH: 5 (ref 5.0–8.0)

## 2019-10-30 LAB — LIPID PANEL
Cholesterol: 180 mg/dL (ref 0–200)
HDL: 40 mg/dL (ref >39.00)
NonHDL: 140.37
Total CHOL/HDL Ratio: 5
Triglycerides: 352 mg/dL — ABNORMAL HIGH (ref 0.0–149.0)
VLDL: 70.4 mg/dL — ABNORMAL HIGH (ref 0.0–40.0)

## 2019-10-30 LAB — TSH: TSH: 1.41 u[IU]/mL (ref 0.35–4.50)

## 2019-10-30 LAB — LDL CHOLESTEROL, DIRECT: Direct LDL: 105 mg/dL

## 2019-10-30 LAB — VITAMIN B12: Vitamin B-12: 315 pg/mL (ref 211–911)

## 2019-10-30 LAB — HEMOGLOBIN A1C: Hgb A1c MFr Bld: 9.2 % — ABNORMAL HIGH (ref 4.6–6.5)

## 2019-10-30 LAB — PSA: PSA: 0.87 ng/mL (ref 0.10–4.00)

## 2019-10-30 LAB — VITAMIN D 25 HYDROXY (VIT D DEFICIENCY, FRACTURES): VITD: 17.23 ng/mL — ABNORMAL LOW (ref 30.00–100.00)

## 2019-10-30 MED ORDER — ONETOUCH VERIO W/DEVICE KIT: 1.0000 | PACK | Freq: Every day | 0 refills | Status: AC

## 2019-10-30 MED ORDER — LANCETS MISC: 3 refills | Status: DC

## 2019-10-30 MED ORDER — ONETOUCH VERIO VI STRP: ORAL_STRIP | 12 refills | Status: DC

## 2019-10-30 NOTE — Patient Instructions (Signed)
Your glucometer and supplies were sent to the pharmacy  Please continue all other medications as before, and refills have been done if requested.  Please have the pharmacy call with any other refills you may need.  Please continue your efforts at being more active, low cholesterol diet, and weight control.  You are otherwise up to date with prevention measures today.  Please keep your appointments with your specialists as you may have planned  Please go to the LAB at the blood drawing area for the tests to be done  You will be contacted by phone if any changes need to be made immediately.  Otherwise, you will receive a letter about your results with an explanation, but please check with MyChart first.  Please remember to sign up for MyChart if you have not done so, as this will be important to you in the future with finding out test results, communicating by private email, and scheduling acute appointments online when needed.  Please make an Appointment to return in 6 months, or sooner if needed

## 2019-10-30 NOTE — Progress Notes (Signed)
Subjective:    Patient ID: Bradley Henson, male    DOB: 23-Aug-1966, 54 y.o.   MRN: [id number]  HPI  Here for wellness and f/u;  Overall doing ok;  Pt denies Chest pain, worsening SOB, DOE, orthopnea, PND, worsening LE edema, palpitations, dizziness or syncope.  Pt denies neurological change such as new headache, facial or extremity weakness.  Pt denies polydipsia, polyuria, or low sugar symptoms. Pt states overall good compliance with treatment and medications, good tolerability, and has been trying to follow appropriate diet.  Pt denies worsening depressive symptoms, suicidal ideation or panic. No fever, night sweats, wt loss, loss of appetite, or other constitutional symptoms.  Pt states good ability with ADL's, has low fall risk, home safety reviewed and adequate, no other significant changes in hearing or vision, and only occasionally active with exercise. Now back to taking all DM meds. Not ready to quit smoking  Has intermittent wheezing that he just puts up with. But asks for refill inhalers. Past Medical History:  Diagnosis Date  . ANXIETY 07/17/2007  . ASTHMA, UNSPECIFIED, UNSPECIFIED STATUS 10/27/2010  . DIABETES MELLITUS, TYPE II 07/17/2007  . ELEVATED BLOOD PRESSURE WITHOUT DIAGNOSIS OF HYPERTENSION 10/13/2007  . GERD 07/17/2007  . HERNIA, UMBILICAL 0000000  . HYPERLIPIDEMIA 07/17/2007  . NEPHROLITHIASIS, HX OF 07/17/2007  . PE 06/02/2008   2 in lungs and 1 in left arm, spontaneous  . Proteinuria 07/17/2007  . PSA, INCREASED 10/03/2009  . SKIN LESION 08/04/2010   right leg    Past Surgical History:  Procedure Laterality Date  . inguinal herniorrhapy  1980s  . LIPOMA EXCISION Left 10/02/2019   Procedure: EXCISION LIPOMA OF LEFT CHEST WALL;  Surgeon: Jovita Kussmaul, MD;  Location: [facility];  Service: General;  Laterality: Left;  . s/p nasal surgury and palatoplasty  1980s  . UMBILICAL HERNIA REPAIR N/A 10/02/2019   Procedure: UMBILICAL HERNIA REPAIR WITH MESH;   Surgeon: Jovita Kussmaul, MD;  Location: [facility];  Service: General;  Laterality: N/A;    reports that he has been smoking. He has never used smokeless tobacco. He reports that he does not drink alcohol or use drugs. family history includes Diabetes in an other family member. No Known Allergies Current Outpatient Medications on File Prior to Visit  Medication Sig Dispense Refill  . amLODipine (NORVASC) 5 MG tablet Take 1 tablet (5 mg total) by mouth daily. 90 tablet 3  . aspirin 81 MG tablet Take 81 mg by mouth daily.      . empagliflozin (JARDIANCE) 25 MG TABS tablet Take 25 mg by mouth daily before breakfast. 90 tablet 3  . glipiZIDE (GLUCOTROL XL) 10 MG 24 hr tablet Take 1 tablet (10 mg total) by mouth daily with breakfast. 90 tablet 3  . HYDROcodone-acetaminophen (NORCO/VICODIN) 5-325 MG tablet Take 1-2 tablets by mouth every 6 (six) hours as needed for moderate pain or severe pain. 15 tablet 0  . irbesartan (AVAPRO) 300 MG tablet TAKE 1 TABLET BY MOUTH EVERY DAY 90 tablet 1  . losartan (COZAAR) 100 MG tablet Take 1 tablet (100 mg total) by mouth daily. 90 tablet 3  . metFORMIN (GLUCOPHAGE) 1000 MG tablet Take 1 tablet (1,000 mg total) by mouth 2 (two) times daily with a meal. 180 tablet 3   No current facility-administered medications on file prior to visit.   Review of Systems All otherwise neg per pt     Objective:   Physical Exam BP 136/74  Pulse 68   Temp 97.9 F (36.6 C)   Ht 6' (1.829 m)   Wt 209 lb (94.8 kg)   SpO2 99%   BMI 28.35 kg/m  VS noted,  Constitutional: Pt appears in NAD HENT: Head: NCAT.  Right Ear: External ear normal.  Left Ear: External ear normal.  Eyes: . Pupils are equal, round, and reactive to light. Conjunctivae and EOM are normal Nose: without d/c or deformity Neck: Neck supple. Gross normal ROM Cardiovascular: Normal rate and regular rhythm.   Pulmonary/Chest: Effort normal and breath sounds without rales or wheezing.  Abd:   Soft, NT, ND, + BS, no organomegaly Neurological: Pt is alert. At baseline orientation, motor grossly intact Skin: Skin is warm. No rashes, other new lesions, no LE edema Psychiatric: Pt behavior is normal without agitation  All otherwise neg per pt  Lab Results  Component Value Date   WBC 13.1 (H) 05/08/2019   HGB 16.7 05/08/2019   HCT 49.3 05/08/2019   PLT 291.0 05/08/2019   GLUCOSE 174 (H) 10/02/2019   CHOL 142 05/08/2019   TRIG 204.0 (H) 05/08/2019   HDL 35.60 (L) 05/08/2019   LDLDIRECT 92.0 05/08/2019   LDLCALC 115 (H) 04/05/2017   ALT 14 05/08/2019   AST 10 05/08/2019   NA 132 (L) 10/02/2019   K 5.1 10/02/2019   CL 101 10/02/2019   CREATININE 1.07 10/02/2019   BUN 27 (H) 10/02/2019   CO2 19 (L) 10/02/2019   TSH 2.00 05/08/2019   PSA 1.04 05/08/2019   INR 2.6 02/11/2009   HGBA1C 11.0 Repeated and verified X2. (H) 05/08/2019   MICROALBUR <0.7 05/08/2019       Assessment & Plan:

## 2019-10-30 NOTE — Assessment & Plan Note (Signed)
S/p recent surgury, doing well

## 2019-10-31 ENCOUNTER — Encounter: Payer: Self-pay | Admitting: Internal Medicine

## 2019-10-31 ENCOUNTER — Other Ambulatory Visit: Payer: Self-pay | Admitting: Internal Medicine

## 2019-10-31 LAB — HIV ANTIBODY (ROUTINE TESTING W REFLEX): HIV 1&2 Ab, 4th Generation: NONREACTIVE

## 2019-10-31 MED ORDER — PIOGLITAZONE HCL 30 MG PO TABS: 30.0000 mg | ORAL_TABLET | Freq: Every day | ORAL | 3 refills | Status: DC

## 2019-10-31 MED ORDER — VITAMIN D (ERGOCALCIFEROL) 1.25 MG (50000 UNIT) PO CAPS: 50000.0000 [IU] | ORAL_CAPSULE | ORAL | 0 refills | Status: DC

## 2019-10-31 NOTE — Assessment & Plan Note (Signed)
stable overall by history and exam, recent data reviewed with pt, and pt to continue medical treatment as before,  to f/u any worsening symptoms or concerns  

## 2019-10-31 NOTE — Assessment & Plan Note (Signed)
Urged to quit 

## 2019-10-31 NOTE — Assessment & Plan Note (Signed)

## 2019-10-31 NOTE — Assessment & Plan Note (Signed)
For med restart 

## 2019-11-28 ENCOUNTER — Other Ambulatory Visit: Payer: Self-pay | Admitting: Internal Medicine

## 2019-11-30 NOTE — Telephone Encounter (Signed)
Please refill as per office routine med refill policy (all routine meds refilled for 3 mo or monthly per pt preference up to one year from last visit, then month to month grace period for 3 mo, then further med refills will have to be denied)  

## 2020-01-24 ENCOUNTER — Other Ambulatory Visit: Payer: Self-pay | Admitting: Internal Medicine

## 2020-01-24 NOTE — Telephone Encounter (Signed)
Please change to OTC Vitamin D3 at 2000 units per day, indefinitely.  

## 2020-01-26 NOTE — Telephone Encounter (Signed)
LM for pt to call back re: Dr Jenny Reichmann request to please change to OTC Vitamin D3 at 2000 units per day, indefinitely.

## 2020-04-10 ENCOUNTER — Other Ambulatory Visit: Payer: Self-pay | Admitting: Internal Medicine

## 2020-04-10 NOTE — Telephone Encounter (Signed)
Please refill as per office routine med refill policy (all routine meds refilled for 3 mo or monthly per pt preference up to one year from last visit, then month to month grace period for 3 mo, then further med refills will have to be denied)  

## 2020-04-17 ENCOUNTER — Other Ambulatory Visit: Payer: Self-pay | Admitting: Internal Medicine

## 2020-04-17 NOTE — Telephone Encounter (Signed)
Please refill as per office routine med refill policy (all routine meds refilled for 3 mo or monthly per pt preference up to one year from last visit, then month to month grace period for 3 mo, then further med refills will have to be denied)  

## 2020-06-29 ENCOUNTER — Other Ambulatory Visit: Payer: Self-pay | Admitting: Internal Medicine

## 2020-06-29 NOTE — Telephone Encounter (Signed)
Please refill as per office routine med refill policy (all routine meds refilled for 3 mo or monthly per pt preference up to one year from last visit, then month to month grace period for 3 mo, then further med refills will have to be denied)  

## 2020-07-04 ENCOUNTER — Encounter: Payer: Self-pay | Admitting: Internal Medicine

## 2020-07-04 NOTE — Telephone Encounter (Signed)
Error

## 2020-07-18 ENCOUNTER — Ambulatory Visit: Payer: Managed Care, Other (non HMO) | Admitting: Internal Medicine

## 2020-11-04 ENCOUNTER — Other Ambulatory Visit: Payer: Self-pay

## 2020-11-07 ENCOUNTER — Encounter: Payer: Self-pay | Admitting: Internal Medicine

## 2020-11-07 ENCOUNTER — Telehealth: Payer: Self-pay | Admitting: Internal Medicine

## 2020-11-07 ENCOUNTER — Other Ambulatory Visit: Payer: Self-pay

## 2020-11-07 ENCOUNTER — Ambulatory Visit (INDEPENDENT_AMBULATORY_CARE_PROVIDER_SITE_OTHER): Payer: Managed Care, Other (non HMO) | Admitting: Internal Medicine

## 2020-11-07 VITALS — BP 142/80 | HR 86 | Temp 97.5°F | Ht 72.0 in | Wt 206.0 lb

## 2020-11-07 DIAGNOSIS — Z1211 Encounter for screening for malignant neoplasm of colon: Secondary | ICD-10-CM

## 2020-11-07 DIAGNOSIS — E538 Deficiency of other specified B group vitamins: Secondary | ICD-10-CM

## 2020-11-07 DIAGNOSIS — E559 Vitamin D deficiency, unspecified: Secondary | ICD-10-CM | POA: Diagnosis not present

## 2020-11-07 DIAGNOSIS — Z0001 Encounter for general adult medical examination with abnormal findings: Secondary | ICD-10-CM

## 2020-11-07 DIAGNOSIS — Z0000 Encounter for general adult medical examination without abnormal findings: Secondary | ICD-10-CM

## 2020-11-07 DIAGNOSIS — J4531 Mild persistent asthma with (acute) exacerbation: Secondary | ICD-10-CM

## 2020-11-07 DIAGNOSIS — Z125 Encounter for screening for malignant neoplasm of prostate: Secondary | ICD-10-CM

## 2020-11-07 DIAGNOSIS — E1165 Type 2 diabetes mellitus with hyperglycemia: Secondary | ICD-10-CM | POA: Diagnosis not present

## 2020-11-07 DIAGNOSIS — E785 Hyperlipidemia, unspecified: Secondary | ICD-10-CM

## 2020-11-07 DIAGNOSIS — I10 Essential (primary) hypertension: Secondary | ICD-10-CM | POA: Diagnosis not present

## 2020-11-07 DIAGNOSIS — Z1159 Encounter for screening for other viral diseases: Secondary | ICD-10-CM | POA: Diagnosis not present

## 2020-11-07 LAB — CBC WITH DIFFERENTIAL/PLATELET
Basophils Absolute: 0.1 10*3/uL (ref 0.0–0.1)
Basophils Relative: 0.5 % (ref 0.0–3.0)
Eosinophils Absolute: 0.1 10*3/uL (ref 0.0–0.7)
Eosinophils Relative: 1.4 % (ref 0.0–5.0)
HCT: 51.9 % (ref 39.0–52.0)
Hemoglobin: 17.5 g/dL — ABNORMAL HIGH (ref 13.0–17.0)
Lymphocytes Relative: 22.7 % (ref 12.0–46.0)
Lymphs Abs: 2.2 10*3/uL (ref 0.7–4.0)
MCHC: 33.8 g/dL (ref 30.0–36.0)
MCV: 94.2 fl (ref 78.0–100.0)
Monocytes Absolute: 0.9 10*3/uL (ref 0.1–1.0)
Monocytes Relative: 9 % (ref 3.0–12.0)
Neutro Abs: 6.5 10*3/uL (ref 1.4–7.7)
Neutrophils Relative %: 66.4 % (ref 43.0–77.0)
Platelets: 286 10*3/uL (ref 150.0–400.0)
RBC: 5.51 Mil/uL (ref 4.22–5.81)
RDW: 13.5 % (ref 11.5–15.5)
WBC: 9.8 10*3/uL (ref 4.0–10.5)

## 2020-11-07 LAB — URINALYSIS, ROUTINE W REFLEX MICROSCOPIC
Bilirubin Urine: NEGATIVE
Ketones, ur: NEGATIVE
Leukocytes,Ua: NEGATIVE
Nitrite: NEGATIVE
RBC / HPF: NONE SEEN (ref 0–?)
Specific Gravity, Urine: <=1.005 — AB (ref 1.000–1.030)
Total Protein, Urine: NEGATIVE
Urine Glucose: >=1000 — AB
Urobilinogen, UA: 0.2 (ref 0.0–1.0)
pH: 5 (ref 5.0–8.0)

## 2020-11-07 LAB — LIPID PANEL
Cholesterol: 162 mg/dL (ref 0–200)
HDL: 39 mg/dL — ABNORMAL LOW (ref >39.00)
LDL Cholesterol: 89 mg/dL (ref 0–99)
NonHDL: 123.02
Total CHOL/HDL Ratio: 4
Triglycerides: 172 mg/dL — ABNORMAL HIGH (ref 0.0–149.0)
VLDL: 34.4 mg/dL (ref 0.0–40.0)

## 2020-11-07 LAB — BASIC METABOLIC PANEL
BUN: 19 mg/dL (ref 6–23)
CO2: 24 mEq/L (ref 19–32)
Calcium: 10 mg/dL (ref 8.4–10.5)
Chloride: 103 mEq/L (ref 96–112)
Creatinine, Ser: 0.81 mg/dL (ref 0.40–1.50)
GFR: 100.44 mL/min (ref >60.00)
Glucose, Bld: 117 mg/dL — ABNORMAL HIGH (ref 70–99)
Potassium: 4.9 mEq/L (ref 3.5–5.1)
Sodium: 137 mEq/L (ref 135–145)

## 2020-11-07 LAB — MICROALBUMIN / CREATININE URINE RATIO
Creatinine,U: 24.1 mg/dL
Microalb Creat Ratio: 2.9 mg/g (ref 0.0–30.0)
Microalb, Ur: <0.7 mg/dL (ref 0.0–1.9)

## 2020-11-07 LAB — VITAMIN D 25 HYDROXY (VIT D DEFICIENCY, FRACTURES): VITD: 19.42 ng/mL — ABNORMAL LOW (ref 30.00–100.00)

## 2020-11-07 LAB — HEPATIC FUNCTION PANEL
ALT: 23 U/L (ref 0–53)
AST: 16 U/L (ref 0–37)
Albumin: 4.6 g/dL (ref 3.5–5.2)
Alkaline Phosphatase: 81 U/L (ref 39–117)
Bilirubin, Direct: 0.1 mg/dL (ref 0.0–0.3)
Total Bilirubin: 0.6 mg/dL (ref 0.2–1.2)
Total Protein: 7.4 g/dL (ref 6.0–8.3)

## 2020-11-07 LAB — PSA: PSA: 0.84 ng/mL (ref 0.10–4.00)

## 2020-11-07 LAB — HEMOGLOBIN A1C: Hgb A1c MFr Bld: 7.4 % — ABNORMAL HIGH (ref 4.6–6.5)

## 2020-11-07 LAB — TSH: TSH: 2.67 u[IU]/mL (ref 0.35–4.50)

## 2020-11-07 LAB — VITAMIN B12: Vitamin B-12: 261 pg/mL (ref 211–911)

## 2020-11-07 MED ORDER — LANCETS MISC: 3 refills | Status: AC

## 2020-11-07 MED ORDER — IRBESARTAN 300 MG PO TABS: 300.0000 mg | ORAL_TABLET | Freq: Every day | ORAL | 3 refills | Status: DC

## 2020-11-07 MED ORDER — GLIPIZIDE ER 10 MG PO TB24: 10.0000 mg | ORAL_TABLET | Freq: Every day | ORAL | 3 refills | Status: DC

## 2020-11-07 MED ORDER — EMPAGLIFLOZIN 25 MG PO TABS: 25.0000 mg | ORAL_TABLET | Freq: Every day | ORAL | 3 refills | Status: DC

## 2020-11-07 MED ORDER — ONETOUCH VERIO VI STRP: ORAL_STRIP | 12 refills | Status: DC

## 2020-11-07 MED ORDER — ALBUTEROL SULFATE HFA 108 (90 BASE) MCG/ACT IN AERS: 2.0000 | INHALATION_SPRAY | Freq: Four times a day (QID) | RESPIRATORY_TRACT | 11 refills | Status: DC | PRN

## 2020-11-07 MED ORDER — AMLODIPINE BESYLATE 5 MG PO TABS: 5.0000 mg | ORAL_TABLET | Freq: Every day | ORAL | 3 refills | Status: DC

## 2020-11-07 NOTE — Assessment & Plan Note (Signed)
Lab Results  Component Value Date   HGBA1C 7.4 (H) 11/07/2020   Stable, pt to continue current medical treatment metformin, jardiance, glipizide - trying to avoid insulin; willing to consider trulicity if needed

## 2020-11-07 NOTE — Assessment & Plan Note (Signed)
Age and sex appropriate education and counseling updated with regular exercise and diet Referrals for preventative services - for cologuard Immunizations addressed - none needed, for covid booster soon Smoking counseling  - none needed Evidence for depression or other mood disorder - none significant Most recent labs reviewed. I have personally reviewed and have noted: 1) the patient's medical and social history 2) The patient's current medications and supplements 3) The patient's height, weight, and BMI have been recorded in the chart

## 2020-11-07 NOTE — Progress Notes (Signed)
Patient ID: Bradley Henson, male   DOB: 01-29-1966, 55 y.o.   MRN: [id number]         Chief Complaint:: wellness exam and f/u DM, htn, hld       HPI:  Bradley Henson is a 55 y.o. male here for wellness exam; plans to get booster covid soon. Asks to do cologuard ; o/w up to date with preventive referrals and immunizations                        Also s/p covid infection x 2.  Had vax x 2.  Not taking actos  - never started actos.  Hopes for trulicity If needed.  Not taking vit d. Pt denies chest pain, increased sob or doe, wheezing, orthopnea, PND, increased LE swelling, palpitations, dizziness or syncope.   Pt denies polydipsia, polyuria, Denies worsening focal neuro s/s.   Pt denies fever, wt loss, night sweats, loss of appetite, or other constitutional symptoms  No other new complaints   Wt Readings from Last 3 Encounters:  11/07/20 206 lb (93.4 kg)  10/30/19 209 lb (94.8 kg)  10/02/19 206 lb 5.6 oz (93.6 kg)   BP Readings from Last 3 Encounters:  11/07/20 (!) 142/80  10/30/19 136/74  10/02/19 (!) 167/99   Immunization History  Administered Date(s) Administered  . Influenza Whole 10/03/2009  . Influenza,inj,Quad PF,6+ Mos 06/15/2018, 07/04/2019  . Pneumococcal Polysaccharide-23 08/31/2008  . Td 08/27/2006   Health Maintenance Due  Topic Date Due  . Hepatitis C Screening  Never done      Past Medical History:  Diagnosis Date  . ANXIETY 07/17/2007  . ASTHMA, UNSPECIFIED, UNSPECIFIED STATUS 10/27/2010  . DIABETES MELLITUS, TYPE II 07/17/2007  . ELEVATED BLOOD PRESSURE WITHOUT DIAGNOSIS OF HYPERTENSION 10/13/2007  . GERD 07/17/2007  . HERNIA, UMBILICAL 12/30/2128  . HYPERLIPIDEMIA 07/17/2007  . NEPHROLITHIASIS, HX OF 07/17/2007  . PE 06/02/2008   2 in lungs and 1 in left arm, spontaneous  . Proteinuria 07/17/2007  . PSA, INCREASED 10/03/2009  . SKIN LESION 08/04/2010   right leg    Past Surgical History:  Procedure Laterality Date  . inguinal herniorrhapy  1980s  . LIPOMA  EXCISION Left 10/02/2019   Procedure: EXCISION LIPOMA OF LEFT CHEST WALL;  Surgeon: Jovita Kussmaul, MD;  Location: [facility];  Service: General;  Laterality: Left;  . s/p nasal surgury and palatoplasty  1980s  . UMBILICAL HERNIA REPAIR N/A 10/02/2019   Procedure: UMBILICAL HERNIA REPAIR WITH MESH;  Surgeon: Jovita Kussmaul, MD;  Location: [facility];  Service: General;  Laterality: N/A;    reports that he has been smoking. He has never used smokeless tobacco. He reports that he does not drink alcohol and does not use drugs. family history includes Diabetes in an other family member. No Known Allergies Current Outpatient Medications on File Prior to Visit  Medication Sig Dispense Refill  . aspirin 81 MG tablet Take 81 mg by mouth daily.    . Blood Glucose Monitoring Suppl (ONETOUCH VERIO) w/Device KIT Apply 1 Device topically daily. E11.9 1 kit 0  . metFORMIN (GLUCOPHAGE) 1000 MG tablet TAKE 1 TABLET BY MOUTH 2 TIMES DAILY WITH A MEAL. 180 tablet 3   No current facility-administered medications on file prior to visit.        ROS:  All others reviewed and negative.  Objective        PE:  BP (!) 142/80  Pulse 86   Temp (!) 97.5 F (36.4 C) (Oral)   Ht 6' (1.829 m)   Wt 206 lb (93.4 kg)   SpO2 95%   BMI 27.94 kg/m                 Constitutional: Pt appears in NAD               HENT: Head: NCAT.                Right Ear: External ear normal.                 Left Ear: External ear normal.                Eyes: . Pupils are equal, round, and reactive to light. Conjunctivae and EOM are normal               Nose: without d/c or deformity               Neck: Neck supple. Gross normal ROM               Cardiovascular: Normal rate and regular rhythm.                 Pulmonary/Chest: Effort normal and breath sounds without rales or wheezing.                Abd:  Soft, NT, ND, + BS, no organomegaly               Neurological: Pt is alert. At baseline orientation,  motor grossly intact               Skin: Skin is warm. No rashes, no other new lesions, LE edema - none               Psychiatric: Pt behavior is normal without agitation   Micro: none  Cardiac tracings I have personally interpreted today:  none  Pertinent Radiological findings (summarize): none   Lab Results  Component Value Date   WBC 9.8 11/07/2020   HGB 17.5 (H) 11/07/2020   HCT 51.9 11/07/2020   PLT 286.0 11/07/2020   GLUCOSE 117 (H) 11/07/2020   CHOL 162 11/07/2020   TRIG 172.0 (H) 11/07/2020   HDL 39.00 (L) 11/07/2020   LDLDIRECT 105.0 10/30/2019   LDLCALC 89 11/07/2020   ALT 23 11/07/2020   AST 16 11/07/2020   NA 137 11/07/2020   K 4.9 11/07/2020   CL 103 11/07/2020   CREATININE 0.81 11/07/2020   BUN 19 11/07/2020   CO2 24 11/07/2020   TSH 2.67 11/07/2020   PSA 0.84 11/07/2020   INR 2.6 02/11/2009   HGBA1C 7.4 (H) 11/07/2020   MICROALBUR <0.7 11/07/2020   Assessment/Plan:  Bradley Henson is a 55 y.o. White or Caucasian [1] male with  has a past medical history of ANXIETY (07/17/2007), ASTHMA, UNSPECIFIED, UNSPECIFIED STATUS (10/27/2010), DIABETES MELLITUS, TYPE II (07/17/2007), ELEVATED BLOOD PRESSURE WITHOUT DIAGNOSIS OF HYPERTENSION (10/13/2007), GERD (29/79/8921), HERNIA, UMBILICAL (09/04/4172), HYPERLIPIDEMIA (07/17/2007), NEPHROLITHIASIS, HX OF (07/17/2007), PE (06/02/2008), Proteinuria (07/17/2007), PSA, INCREASED (10/03/2009), and SKIN LESION (08/04/2010).  Encounter for well adult exam with abnormal findings Age and sex appropriate education and counseling updated with regular exercise and diet Referrals for preventative services - for cologuard Immunizations addressed - none needed, for covid booster soon Smoking counseling  - none needed Evidence for depression or other mood disorder - none significant Most recent labs reviewed. I have personally  reviewed and have noted: 1) the patient's medical and social history 2) The patient's current medications and  supplements 3) The patient's height, weight, and BMI have been recorded in the chart   Asthma Stable overall,  to f/u any worsening symptoms or concerns  Diabetes ([place]) Lab Results  Component Value Date   HGBA1C 7.4 (H) 11/07/2020   Stable, pt to continue current medical treatment metformin, jardiance, glipizide - trying to avoid insulin; willing to consider trulicity if needed   HTN (hypertension) BP Readings from Last 3 Encounters:  11/07/20 (!) 142/80  10/30/19 136/74  10/02/19 (!) 167/99   Stable, pt states BP ok at home and recently passed DOT physical, pt to continue medical treatment norvasc, avapro   Hyperlipidemia Lab Results  Component Value Date   LDLCALC 89 11/07/2020   Stable, pt to continue current low chol diet, declines statin  Vitamin D deficiency Last vitamin D Lab Results  Component Value Date   VD25OH 19.42 (L) 11/07/2020   Low, to start vit d3 2000 u oral replacement   Followup: Return in about 6 months (around 05/10/2021).  Cathlean Cower, MD 11/07/2020 10:51 PM [facility]

## 2020-11-07 NOTE — Assessment & Plan Note (Signed)
Lab Results  Component Value Date   LDLCALC 89 11/07/2020   Stable, pt to continue current low chol diet, declines statin

## 2020-11-07 NOTE — Assessment & Plan Note (Signed)
Stable overall,  to f/u any worsening symptoms or concerns 

## 2020-11-07 NOTE — Assessment & Plan Note (Signed)
BP Readings from Last 3 Encounters:  11/07/20 (!) 142/80  10/30/19 136/74  10/02/19 (!) 167/99   Stable, pt states BP ok at home and recently passed DOT physical, pt to continue medical treatment norvasc, avapro

## 2020-11-07 NOTE — Patient Instructions (Addendum)
You should be notified about the cologuard soon  Please take  OTC Vitamin D3 at 2000 units per day, indefinitely.  Please continue to monitor your BP at home on a regular basis, as the goal is to be at least less than 140/90  Please continue all other medications as before, and refills have been done if requested.  Please have the pharmacy call with any other refills you may need.  Please continue your efforts at being more active, low cholesterol diet, and weight control.  You are otherwise up to date with prevention measures today.  Please keep your appointments with your specialists as you may have planned  Please go to the LAB at the blood drawing area for the tests to be done  You will be contacted by phone if any changes need to be made immediately.  Otherwise, you will receive a letter about your results with an explanation, but please check with MyChart first.  Please remember to sign up for MyChart if you have not done so, as this will be important to you in the future with finding out test results, communicating by private email, and scheduling acute appointments online when needed.  Please make an Appointment to return in 6 months, or sooner if needed, also with Lab Appointment for testing done 3-5 days before at the [facility] (so this is for TWO appointments - please see the scheduling desk as you leave)  Due to the ongoing Covid 19 pandemic, our lab now requires an appointment for any labs done at our office.  If you need labs done and do not have an appointment, please call our office ahead of time to schedule before presenting to the lab for your testing.

## 2020-11-07 NOTE — Assessment & Plan Note (Signed)
Last vitamin D Lab Results  Component Value Date   VD25OH 19.42 (L) 11/07/2020   Low, to start vit d3 2000 u oral replacement

## 2020-11-07 NOTE — Telephone Encounter (Signed)
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

## 2020-11-08 NOTE — Telephone Encounter (Signed)
Patient has been notified of results.  

## 2020-11-10 LAB — HCV RNA,QUANTITATIVE REAL TIME PCR
HCV Quantitative Log: <1.18 Log IU/mL
HCV RNA, PCR, QN: <15 IU/mL

## 2020-11-10 LAB — HEPATITIS C ANTIBODY
Hepatitis C Ab: REACTIVE — AB
SIGNAL TO CUT-OFF: 1.36 — ABNORMAL HIGH (ref <1.00)

## 2020-11-11 ENCOUNTER — Encounter: Payer: Self-pay | Admitting: Internal Medicine

## 2021-05-20 ENCOUNTER — Other Ambulatory Visit: Payer: Self-pay | Admitting: Internal Medicine

## 2021-05-20 NOTE — Telephone Encounter (Signed)
Please refill as per office routine med refill policy (all routine meds to be refilled for 3 mo or monthly (per pt preference) up to one year from last visit, then month to month grace period for 3 mo, then further med refills will have to be denied) ? ?

## 2021-05-22 ENCOUNTER — Telehealth: Payer: Self-pay

## 2021-05-22 NOTE — Telephone Encounter (Signed)
1.Medication Requested: Metaformin  2. Pharmacy (Name, [facility]): [facility]	[place]  3. On Med List: Yes  4. Last Visit with PCP: 11/07/2020  5. 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   Agent: Please be advised that RX refills may take up to 3 business days. We ask that you follow-up with your pharmacy.

## 2021-05-22 NOTE — Telephone Encounter (Signed)
Duplicate request

## 2021-05-23 ENCOUNTER — Other Ambulatory Visit: Payer: Self-pay | Admitting: Internal Medicine

## 2021-05-23 NOTE — Telephone Encounter (Signed)
Please refill as per office routine med refill policy (all routine meds to be refilled for 3 mo or monthly (per pt preference) up to one year from last visit, then month to month grace period for 3 mo, then further med refills will have to be denied) ? ?

## 2021-07-03 ENCOUNTER — Other Ambulatory Visit: Payer: Self-pay

## 2021-07-03 ENCOUNTER — Ambulatory Visit: Payer: Managed Care, Other (non HMO) | Admitting: Internal Medicine

## 2021-07-03 ENCOUNTER — Encounter: Payer: Self-pay | Admitting: Internal Medicine

## 2021-07-03 VITALS — BP 126/80 | HR 93 | Temp 98.0°F | Ht 72.0 in | Wt 192.0 lb

## 2021-07-03 DIAGNOSIS — F17200 Nicotine dependence, unspecified, uncomplicated: Secondary | ICD-10-CM

## 2021-07-03 DIAGNOSIS — I10 Essential (primary) hypertension: Secondary | ICD-10-CM | POA: Diagnosis not present

## 2021-07-03 DIAGNOSIS — E538 Deficiency of other specified B group vitamins: Secondary | ICD-10-CM

## 2021-07-03 DIAGNOSIS — E7800 Pure hypercholesterolemia, unspecified: Secondary | ICD-10-CM | POA: Diagnosis not present

## 2021-07-03 DIAGNOSIS — E1165 Type 2 diabetes mellitus with hyperglycemia: Secondary | ICD-10-CM

## 2021-07-03 DIAGNOSIS — Z0000 Encounter for general adult medical examination without abnormal findings: Secondary | ICD-10-CM

## 2021-07-03 DIAGNOSIS — E559 Vitamin D deficiency, unspecified: Secondary | ICD-10-CM | POA: Diagnosis not present

## 2021-07-03 LAB — POCT GLYCOSYLATED HEMOGLOBIN (HGB A1C): Hemoglobin A1C: 6.9 % — AB (ref 4.0–5.6)

## 2021-07-03 MED ORDER — ATORVASTATIN CALCIUM 10 MG PO TABS: 10.0000 mg | ORAL_TABLET | Freq: Every day | ORAL | 3 refills | Status: DC

## 2021-07-03 NOTE — Assessment & Plan Note (Signed)
Lab Results  Component Value Date   LDLCALC 89 11/07/2020   Mild uncontrolled, goal ldl < 70, pt to start lipitor 10 qd, low chol DM diet

## 2021-07-03 NOTE — Patient Instructions (Addendum)
Your A1c was OK today  Please take OTC Vitamin D3 at 2000 units per day, indefinitely  Please stop smoking- and call if you change your mind about the Chantix   Please take all new medication as prescribed - the lipitor 10 mg per day  Please continue all other medications as before, and refills have been done if requested.  Please have the pharmacy call with any other refills you may need.  Please continue your efforts at being more active, low cholesterol diet, and weight control.  Please keep your appointments with your specialists as you may have planned  Please call if you change your mind about the Cardiac CT Score testing for the heart, the LDCT (low dose CT scan testing done on yearly basis with pulmonary)  Please make an Appointment to return in 6 months, or sooner if needed, also with Lab Appointment for testing done 3-5 days before at the [facility] (so this is for TWO appointments - please see the scheduling desk as you leave)  Due to the ongoing Covid 19 pandemic, our lab now requires an appointment for any labs done at our office.  If you need labs done and do not have an appointment, please call our office ahead of time to schedule before presenting to the lab for your testing.

## 2021-07-03 NOTE — Assessment & Plan Note (Signed)
Counseled to quit, pt not ready, declines chantix, LDCT program start for now, and will consider Cardiac CT score

## 2021-07-03 NOTE — Assessment & Plan Note (Signed)
BP Readings from Last 3 Encounters:  07/03/21 126/80  11/07/20 (!) 142/80  10/30/19 136/74   Improved, stable, pt to continue medical treatment amlodipien, avapro

## 2021-07-03 NOTE — Progress Notes (Addendum)
Patient ID: Bradley Henson, male   DOB: 1966-01-16, 55 y.o.   MRN: [id number]        Chief Complaint: follow up HTN, HLD and hyperglycemia, smoking, low vit d       HPI:  Bradley Henson is a 55 y.o. male here overall doing well, Pt denies chest pain, increased sob or doe, wheezing, orthopnea, PND, increased LE swelling, palpitations, dizziness or syncope.   Pt denies polydipsia, polyuria, or new focal neuro s/s.  No low sugars, still works as over the road Administrator.   Had run out of lipitor, glipizide for sevaral days only.   Peak wt has been up to 258 in the past, now beter with diet.  Not taking Vit D.  Pt is aware but not ready for chantix, not ready to quit smoking yet. Pt willing to start statin and VIt D.  No other new complaints Wt Readings from Last 3 Encounters:  07/03/21 192 lb (87.1 kg)  11/07/20 206 lb (93.4 kg)  10/30/19 209 lb (94.8 kg)   BP Readings from Last 3 Encounters:  07/03/21 126/80  11/07/20 (!) 142/80  10/30/19 136/74         Past Medical History:  Diagnosis Date   ANXIETY 07/17/2007   ASTHMA, UNSPECIFIED, UNSPECIFIED STATUS 10/27/2010   DIABETES MELLITUS, TYPE II 07/17/2007   ELEVATED BLOOD PRESSURE WITHOUT DIAGNOSIS OF HYPERTENSION 10/13/2007   GERD 94/49/6759   HERNIA, UMBILICAL 09/01/3844   HYPERLIPIDEMIA 07/17/2007   NEPHROLITHIASIS, HX OF 07/17/2007   PE 06/02/2008   2 in lungs and 1 in left arm, spontaneous   Proteinuria 07/17/2007   PSA, INCREASED 10/03/2009   SKIN LESION 08/04/2010   right leg    Past Surgical History:  Procedure Laterality Date   inguinal herniorrhapy  1980s   LIPOMA EXCISION Left 10/02/2019   Procedure: EXCISION LIPOMA OF LEFT CHEST WALL;  Surgeon: Jovita Kussmaul, MD;  Location: [facility];  Service: General;  Laterality: Left;   s/p nasal surgury and palatoplasty  6599J   UMBILICAL HERNIA REPAIR N/A 10/02/2019   Procedure: UMBILICAL HERNIA REPAIR WITH MESH;  Surgeon: Jovita Kussmaul, MD;  Location: [facility];  Service: General;  Laterality: N/A;    reports that he has been smoking. He has never used smokeless tobacco. He reports that he does not drink alcohol and does not use drugs. family history includes Diabetes in an other family member. No Known Allergies Current Outpatient Medications on File Prior to Visit  Medication Sig Dispense Refill   albuterol (VENTOLIN HFA) 108 (90 Base) MCG/ACT inhaler Inhale 2 puffs into the lungs every 6 (six) hours as needed for wheezing or shortness of breath. 8 g 11   amLODipine (NORVASC) 5 MG tablet Take 1 tablet (5 mg total) by mouth daily. 90 tablet 3   aspirin 81 MG tablet Take 81 mg by mouth daily.     Blood Glucose Monitoring Suppl (ONETOUCH VERIO) w/Device KIT Apply 1 Device topically daily. E11.9 1 kit 0   empagliflozin (JARDIANCE) 25 MG TABS tablet Take 1 tablet (25 mg total) by mouth daily before breakfast. 90 tablet 3   glipiZIDE (GLUCOTROL XL) 10 MG 24 hr tablet Take 1 tablet (10 mg total) by mouth daily with breakfast. 90 tablet 3   glucose blood (ONETOUCH VERIO) test strip Use as instructed once daily E11.9 100 each 12   irbesartan (AVAPRO) 300 MG tablet Take 1 tablet (300 mg total) by mouth daily.  90 tablet 3   Lancets MISC Use as directed once daily E11.9 100 each 3   metFORMIN (GLUCOPHAGE) 1000 MG tablet TAKE 1 TABLET BY MOUTH 2 TIMES DAILY WITH A MEAL. 180 tablet 3   No current facility-administered medications on file prior to visit.        ROS:  All others reviewed and negative.  Objective        PE:  BP 126/80 (BP Location: Left Arm, Patient Position: Sitting, Cuff Size: Large)   Pulse 93   Temp 98 F (36.7 C) (Oral)   Ht 6' (1.829 m)   Wt 192 lb (87.1 kg)   SpO2 95%   BMI 26.04 kg/m                 Constitutional: Pt appears in NAD               HENT: Head: NCAT.                Right Ear: External ear normal.                 Left Ear: External ear normal.                Eyes: . Pupils are equal, round, and reactive to  light. Conjunctivae and EOM are normal               Nose: without d/c or deformity               Neck: Neck supple. Gross normal ROM               Cardiovascular: Normal rate and regular rhythm.                 Pulmonary/Chest: Effort normal and breath sounds without rales or wheezing.                Abd:  Soft, NT, ND, + BS, no organomegaly               Neurological: Pt is alert. At baseline orientation, motor grossly intact               Skin: Skin is warm. No rashes, no other new lesions, LE edema - none               Psychiatric: Pt behavior is normal without agitation   Micro: none  Cardiac tracings I have personally interpreted today:  none  Pertinent Radiological findings (summarize): none   Lab Results  Component Value Date   WBC 9.8 11/07/2020   HGB 17.5 (H) 11/07/2020   HCT 51.9 11/07/2020   PLT 286.0 11/07/2020   GLUCOSE 117 (H) 11/07/2020   CHOL 162 11/07/2020   TRIG 172.0 (H) 11/07/2020   HDL 39.00 (L) 11/07/2020   LDLDIRECT 105.0 10/30/2019   LDLCALC 89 11/07/2020   ALT 23 11/07/2020   AST 16 11/07/2020   NA 137 11/07/2020   K 4.9 11/07/2020   CL 103 11/07/2020   CREATININE 0.81 11/07/2020   BUN 19 11/07/2020   CO2 24 11/07/2020   TSH 2.67 11/07/2020   PSA 0.84 11/07/2020   INR 2.6 02/11/2009   HGBA1C 7.4 (H) 11/07/2020   MICROALBUR <0.7 11/07/2020   Hemoglobin A1C 4.0 - 5.6 % 6.9 Abnormal   7.4 High  R, CM  9.2 High  R, CM  11.0 Repeated and verified X2. High  R, CM  9.2 High  R, CM  8.7  R  10.6 High     Assessment/Plan:  Bradley Henson is a 55 y.o. White or Caucasian [1] male with  has a past medical history of ANXIETY (07/17/2007), ASTHMA, UNSPECIFIED, UNSPECIFIED STATUS (10/27/2010), DIABETES MELLITUS, TYPE II (07/17/2007), ELEVATED BLOOD PRESSURE WITHOUT DIAGNOSIS OF HYPERTENSION (10/13/2007), GERD (33/54/5625), HERNIA, UMBILICAL (01/28/8936), HYPERLIPIDEMIA (07/17/2007), NEPHROLITHIASIS, HX OF (07/17/2007), PE (06/02/2008), Proteinuria (07/17/2007), PSA,  INCREASED (10/03/2009), and SKIN LESION (08/04/2010).  Vitamin D deficiency Last vitamin D Lab Results  Component Value Date   VD25OH 19.42 (L) 11/07/2020   Low, to start oral replacement   Diabetes ([place]) POCT A1c - 6.9 today  improving, pt to continue current medical treatment jarciance, glucotrol, metformin   Hyperlipidemia Lab Results  Component Value Date   LDLCALC 89 11/07/2020   Mild uncontrolled, goal ldl < 70, pt to start lipitor 10 qd, low chol DM diet   HTN (hypertension) BP Readings from Last 3 Encounters:  07/03/21 126/80  11/07/20 (!) 142/80  10/30/19 136/74   Improved, stable, pt to continue medical treatment amlodipien, avapro   Smoker Counseled to quit, pt not ready, declines chantix, LDCT program start for now, and will consider Cardiac CT score  Followup: Return in about 6 months (around 12/31/2021).  Cathlean Cower, MD 07/03/2021 9:16 AM [facility]

## 2021-07-03 NOTE — Assessment & Plan Note (Signed)
Last vitamin D Lab Results  Component Value Date   VD25OH 19.42 (L) 11/07/2020   Low, to start oral replacement

## 2021-07-03 NOTE — Addendum Note (Signed)
Addended by: Marijean Heath R on: 07/03/2021 10:41 AM   Modules accepted: Orders

## 2021-07-03 NOTE — Assessment & Plan Note (Signed)
POCT A1c - 6.9 today  improving, pt to continue current medical treatment jarciance, glucotrol, metformin

## 2021-11-26 ENCOUNTER — Other Ambulatory Visit: Payer: Self-pay | Admitting: Internal Medicine

## 2021-12-25 ENCOUNTER — Other Ambulatory Visit: Payer: Self-pay | Admitting: Internal Medicine

## 2021-12-25 NOTE — Telephone Encounter (Signed)
Please refill as per office routine med refill policy (all routine meds to be refilled for 3 mo or monthly (per pt preference) up to one year from last visit, then month to month grace period for 3 mo, then further med refills will have to be denied) ? ?

## 2021-12-26 ENCOUNTER — Other Ambulatory Visit: Payer: Self-pay | Admitting: Internal Medicine

## 2021-12-26 NOTE — Telephone Encounter (Signed)
Please refill as per office routine med refill policy (all routine meds to be refilled for 3 mo or monthly (per pt preference) up to one year from last visit, then month to month grace period for 3 mo, then further med refills will have to be denied) ? ?

## 2022-02-03 ENCOUNTER — Other Ambulatory Visit: Payer: Self-pay | Admitting: Internal Medicine

## 2022-02-03 NOTE — Telephone Encounter (Signed)
Please refill as per office routine med refill policy (all routine meds to be refilled for 3 mo or monthly (per pt preference) up to one year from last visit, then month to month grace period for 3 mo, then further med refills will have to be denied) ? ?

## 2022-02-05 ENCOUNTER — Telehealth: Payer: Self-pay | Admitting: Internal Medicine

## 2022-02-05 NOTE — Telephone Encounter (Signed)
1.Medication Requested: irbesartan (AVAPRO) 300 MG tablet 2. Pharmacy (Name, Street, [place]): [facility], [place] Phone:  [phone]  Fax:  [phone]     3. On Med List: yes   4. Last Visit with PCP:  5. Next visit date with PCP:   Agent: Please be advised that RX refills may take up to 3 business days. We ask that you follow-up with your pharmacy.

## 2022-02-16 MED ORDER — IRBESARTAN 300 MG PO TABS: 300.0000 mg | ORAL_TABLET | Freq: Every day | ORAL | 1 refills | Status: DC

## 2022-04-30 ENCOUNTER — Other Ambulatory Visit: Payer: Self-pay | Admitting: Internal Medicine

## 2022-04-30 NOTE — Telephone Encounter (Signed)
Please refill as per office routine med refill policy (all routine meds to be refilled for 3 mo or monthly (per pt preference) up to one year from last visit, then month to month grace period for 3 mo, then further med refills will have to be denied) ? ?

## 2022-06-06 ENCOUNTER — Other Ambulatory Visit: Payer: Self-pay | Admitting: Internal Medicine

## 2022-06-06 NOTE — Telephone Encounter (Signed)
Please refill as per office routine med refill policy (all routine meds to be refilled for 3 mo or monthly (per pt preference) up to one year from last visit, then month to month grace period for 3 mo, then further med refills will have to be denied) ? ?

## 2022-06-10 ENCOUNTER — Other Ambulatory Visit: Payer: Self-pay | Admitting: Internal Medicine

## 2022-06-10 NOTE — Telephone Encounter (Signed)
Please refill as per office routine med refill policy (all routine meds to be refilled for 3 mo or monthly (per pt preference) up to one year from last visit, then month to month grace period for 3 mo, then further med refills will have to be denied) ? ?

## 2022-06-25 ENCOUNTER — Other Ambulatory Visit: Payer: Self-pay | Admitting: Internal Medicine

## 2022-06-26 NOTE — Telephone Encounter (Signed)
Please refill as per office routine med refill policy (all routine meds to be refilled for 3 mo or monthly (per pt preference) up to one year from last visit, then month to month grace period for 3 mo, then further med refills will have to be denied) ? ?

## 2022-07-06 ENCOUNTER — Telehealth: Payer: Self-pay | Admitting: Internal Medicine

## 2022-07-06 MED ORDER — EMPAGLIFLOZIN 25 MG PO TABS: 25.0000 mg | ORAL_TABLET | Freq: Every day | ORAL | 0 refills | Status: DC

## 2022-07-06 NOTE — Telephone Encounter (Signed)
Caller Name: Tyre Beaver Call back phone #: [phone]   MEDICATION(S):  empagliflozin (JARDIANCE) 25 MG TABS tablet   Days of Med Remaining:  Has been out for a few weeks Has the patient contacted their pharmacy (YES/NO)? yes What did pharmacy advise? Script has expired  [facility] [place] [facility] [place]   ~~~Please advise patient/caregiver to allow 2-3 business days to process RX refills.   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

## 2022-07-06 NOTE — Telephone Encounter (Signed)
Ok this is done 

## 2022-07-16 ENCOUNTER — Telehealth: Payer: Self-pay

## 2022-07-16 NOTE — Telephone Encounter (Signed)
Pt PA is approved

## 2022-07-16 NOTE — Telephone Encounter (Signed)
Pt PA is Jardiance '25mg'$  is send to plan and waiting for approval

## 2022-07-29 ENCOUNTER — Other Ambulatory Visit: Payer: Self-pay | Admitting: Internal Medicine

## 2022-07-29 NOTE — Telephone Encounter (Signed)
Please refill as per office routine med refill policy (all routine meds to be refilled for 3 mo or monthly (per pt preference) up to one year from last visit, then month to month grace period for 3 mo, then further med refills will have to be denied) ? ?

## 2022-08-09 ENCOUNTER — Other Ambulatory Visit: Payer: Self-pay | Admitting: Internal Medicine

## 2022-08-09 NOTE — Telephone Encounter (Signed)
Please refill as per office routine med refill policy (all routine meds to be refilled for 3 mo or monthly (per pt preference) up to one year from last visit, then month to month grace period for 3 mo, then further med refills will have to be denied) ? ?

## 2022-08-10 ENCOUNTER — Other Ambulatory Visit: Payer: Self-pay | Admitting: Internal Medicine

## 2022-08-10 NOTE — Telephone Encounter (Signed)
Please refill as per office routine med refill policy (all routine meds to be refilled for 3 mo or monthly (per pt preference) up to one year from last visit, then month to month grace period for 3 mo, then further med refills will have to be denied) ? ?

## 2022-08-23 ENCOUNTER — Ambulatory Visit: Payer: Managed Care, Other (non HMO) | Admitting: Internal Medicine

## 2022-08-23 ENCOUNTER — Encounter: Payer: Self-pay | Admitting: Internal Medicine

## 2022-08-23 VITALS — BP 130/80 | HR 95 | Temp 98.0°F | Ht 72.0 in | Wt 199.0 lb

## 2022-08-23 DIAGNOSIS — E1165 Type 2 diabetes mellitus with hyperglycemia: Secondary | ICD-10-CM | POA: Diagnosis not present

## 2022-08-23 DIAGNOSIS — E538 Deficiency of other specified B group vitamins: Secondary | ICD-10-CM | POA: Diagnosis not present

## 2022-08-23 DIAGNOSIS — Z23 Encounter for immunization: Secondary | ICD-10-CM

## 2022-08-23 DIAGNOSIS — Z0001 Encounter for general adult medical examination with abnormal findings: Secondary | ICD-10-CM | POA: Diagnosis not present

## 2022-08-23 DIAGNOSIS — E559 Vitamin D deficiency, unspecified: Secondary | ICD-10-CM

## 2022-08-23 DIAGNOSIS — E7800 Pure hypercholesterolemia, unspecified: Secondary | ICD-10-CM

## 2022-08-23 DIAGNOSIS — I10 Essential (primary) hypertension: Secondary | ICD-10-CM | POA: Diagnosis not present

## 2022-08-23 DIAGNOSIS — F17200 Nicotine dependence, unspecified, uncomplicated: Secondary | ICD-10-CM

## 2022-08-23 DIAGNOSIS — Z0000 Encounter for general adult medical examination without abnormal findings: Secondary | ICD-10-CM

## 2022-08-23 LAB — URINALYSIS, ROUTINE W REFLEX MICROSCOPIC
Bilirubin Urine: NEGATIVE
Hgb urine dipstick: NEGATIVE
Leukocytes,Ua: NEGATIVE
Nitrite: NEGATIVE
RBC / HPF: NONE SEEN (ref 0–?)
Specific Gravity, Urine: 1.015 (ref 1.000–1.030)
Total Protein, Urine: NEGATIVE
Urine Glucose: >=1000 — AB
Urobilinogen, UA: 0.2 (ref 0.0–1.0)
pH: 5.5 (ref 5.0–8.0)

## 2022-08-23 LAB — CBC WITH DIFFERENTIAL/PLATELET
Basophils Absolute: 0.1 10*3/uL (ref 0.0–0.1)
Basophils Relative: 0.7 % (ref 0.0–3.0)
Eosinophils Absolute: 0.2 10*3/uL (ref 0.0–0.7)
Eosinophils Relative: 1.4 % (ref 0.0–5.0)
HCT: 50.3 % (ref 39.0–52.0)
Hemoglobin: 17.1 g/dL — ABNORMAL HIGH (ref 13.0–17.0)
Lymphocytes Relative: 26.9 % (ref 12.0–46.0)
Lymphs Abs: 2.8 10*3/uL (ref 0.7–4.0)
MCHC: 34.1 g/dL (ref 30.0–36.0)
MCV: 92.7 fl (ref 78.0–100.0)
Monocytes Absolute: 1.1 10*3/uL — ABNORMAL HIGH (ref 0.1–1.0)
Monocytes Relative: 10.2 % (ref 3.0–12.0)
Neutro Abs: 6.4 10*3/uL (ref 1.4–7.7)
Neutrophils Relative %: 60.8 % (ref 43.0–77.0)
Platelets: 308 10*3/uL (ref 150.0–400.0)
RBC: 5.43 Mil/uL (ref 4.22–5.81)
RDW: 13.5 % (ref 11.5–15.5)
WBC: 10.5 10*3/uL (ref 4.0–10.5)

## 2022-08-23 LAB — LIPID PANEL
Cholesterol: 177 mg/dL (ref 0–200)
HDL: 44.6 mg/dL (ref >39.00)
NonHDL: 132.43
Total CHOL/HDL Ratio: 4
Triglycerides: 287 mg/dL — ABNORMAL HIGH (ref 0.0–149.0)
VLDL: 57.4 mg/dL — ABNORMAL HIGH (ref 0.0–40.0)

## 2022-08-23 LAB — BASIC METABOLIC PANEL
BUN: 22 mg/dL (ref 6–23)
CO2: 27 mEq/L (ref 19–32)
Calcium: 10 mg/dL (ref 8.4–10.5)
Chloride: 102 mEq/L (ref 96–112)
Creatinine, Ser: 0.91 mg/dL (ref 0.40–1.50)
GFR: 94.81 mL/min (ref >60.00)
Glucose, Bld: 200 mg/dL — ABNORMAL HIGH (ref 70–99)
Potassium: 4.3 mEq/L (ref 3.5–5.1)
Sodium: 139 mEq/L (ref 135–145)

## 2022-08-23 LAB — HEPATIC FUNCTION PANEL
ALT: 26 U/L (ref 0–53)
AST: 16 U/L (ref 0–37)
Albumin: 4.8 g/dL (ref 3.5–5.2)
Alkaline Phosphatase: 96 U/L (ref 39–117)
Bilirubin, Direct: 0.1 mg/dL (ref 0.0–0.3)
Total Bilirubin: 0.4 mg/dL (ref 0.2–1.2)
Total Protein: 7.4 g/dL (ref 6.0–8.3)

## 2022-08-23 LAB — LDL CHOLESTEROL, DIRECT: Direct LDL: 100 mg/dL

## 2022-08-23 LAB — HEMOGLOBIN A1C: Hgb A1c MFr Bld: 8.7 % — ABNORMAL HIGH (ref 4.6–6.5)

## 2022-08-23 LAB — MICROALBUMIN / CREATININE URINE RATIO
Creatinine,U: 37.3 mg/dL
Microalb Creat Ratio: 3.3 mg/g (ref 0.0–30.0)
Microalb, Ur: 1.2 mg/dL (ref 0.0–1.9)

## 2022-08-23 NOTE — Patient Instructions (Addendum)
You had the flu shot today  Please have your Shingrix (shingles) shots done at your local pharmacy, and the Tdap tetanus shot as well  Please continue all other medications as before, and refills have been done if requested.  Please have the pharmacy call with any other refills you may need.  Please continue your efforts at being more active, low cholesterol diet, and weight control.  You are otherwise up to date with prevention measures today.  Please keep your appointments with your specialists as you may have planned  Please go to the LAB at the blood drawing area for the tests to be done  You will be contacted by phone if any changes need to be made immediately.  Otherwise, you will receive a letter about your results with an explanation, but please check with MyChart first.  Please remember to sign up for MyChart if you have not done so, as this will be important to you in the future with finding out test results, communicating by private email, and scheduling acute appointments online when needed.  Please make an Appointment to return in 6 months, or sooner if needed

## 2022-08-23 NOTE — Assessment & Plan Note (Signed)
Last vitamin D Lab Results  Component Value Date   VD25OH 19.42 (L) 11/07/2020   Low, reminded to start oral replacement d

## 2022-08-23 NOTE — Progress Notes (Signed)
Patient ID: ISAIR INABINET, male   DOB: [birth year], 56 y.o.   MRN: [id number]         Chief Complaint:: wellness exam and low vit d, DM, htn hld       HPI:  Bradley Henson is a 56 y.o. male here for wellness exam; pt will have tdap and shingrix at pharmacy, will call himself for optho appt next month, declines colonoscopy; still smoking but is giving some thought to quitting soon, just not ready yet.  O/w up to date. For flu shot today                        Also missed jardiance x 3 wks and took leftover metformin for this time, but wants to go back to [place].  Pt denies chest pain, increased sob or doe, wheezing, orthopnea, PND, increased LE swelling, palpitations, dizziness or syncope.   Pt denies polydipsia, polyuria, or new focal neuro s/s.    Pt denies fever, wt loss, night sweats, loss of appetite, or other constitutional symptoms     Wt Readings from Last 3 Encounters:  08/23/22 199 lb (90.3 kg)  07/03/21 192 lb (87.1 kg)  11/07/20 206 lb (93.4 kg)   BP Readings from Last 3 Encounters:  08/23/22 130/80  07/03/21 126/80  11/07/20 (!) 142/80   Immunization History  Administered Date(s) Administered   Influenza Whole 10/03/2009   Influenza,inj,Quad PF,6+ Mos 06/15/2018, 07/04/2019, 08/23/2022   Pneumococcal Polysaccharide-23 08/31/2008   Td 08/27/2006   Health Maintenance Due  Topic Date Due   DTaP/Tdap/Td (2 - Tdap) 08/27/2016   Zoster Vaccines- Shingrix (1 of 2) Never done   Diabetic kidney evaluation - eGFR measurement  11/07/2021   Diabetic kidney evaluation - Urine ACR  11/07/2021   HEMOGLOBIN A1C  12/31/2021      Past Medical History:  Diagnosis Date   ANXIETY 07/17/2007   ASTHMA, UNSPECIFIED, UNSPECIFIED STATUS 10/27/2010   DIABETES MELLITUS, TYPE II 07/17/2007   ELEVATED BLOOD PRESSURE WITHOUT DIAGNOSIS OF HYPERTENSION 10/13/2007   GERD 99/35/7017   HERNIA, UMBILICAL 03/04/3902   HYPERLIPIDEMIA 07/17/2007   NEPHROLITHIASIS, HX OF 07/17/2007   PE 06/02/2008   2  in lungs and 1 in left arm, spontaneous   Proteinuria 07/17/2007   PSA, INCREASED 10/03/2009   SKIN LESION 08/04/2010   right leg    Past Surgical History:  Procedure Laterality Date   inguinal herniorrhapy  1980s   LIPOMA EXCISION Left 10/02/2019   Procedure: EXCISION LIPOMA OF LEFT CHEST WALL;  Surgeon: Jovita Kussmaul, MD;  Location: [facility];  Service: General;  Laterality: Left;   s/p nasal surgury and palatoplasty  0092Z   UMBILICAL HERNIA REPAIR N/A 10/02/2019   Procedure: UMBILICAL HERNIA REPAIR WITH MESH;  Surgeon: Jovita Kussmaul, MD;  Location: [facility];  Service: General;  Laterality: N/A;    reports that he has been smoking. He has never used smokeless tobacco. He reports that he does not drink alcohol and does not use drugs. family history includes Diabetes in an other family member. No Known Allergies Current Outpatient Medications on File Prior to Visit  Medication Sig Dispense Refill   albuterol (VENTOLIN HFA) 108 (90 Base) MCG/ACT inhaler TAKE 2 PUFFS BY MOUTH EVERY 6 HOURS AS NEEDED FOR WHEEZE OR SHORTNESS OF BREATH 8.5 each 6   amLODipine (NORVASC) 5 MG tablet TAKE 1 TABLET (5 MG TOTAL) BY MOUTH DAILY. 30 tablet 0  aspirin 81 MG tablet Take 81 mg by mouth daily.     atorvastatin (LIPITOR) 10 MG tablet TAKE 1 TABLET BY MOUTH EVERY DAY 30 tablet 0   Blood Glucose Monitoring Suppl (ONETOUCH VERIO) w/Device KIT Apply 1 Device topically daily. E11.9 1 kit 0   empagliflozin (JARDIANCE) 25 MG TABS tablet Take 1 tablet (25 mg total) by mouth daily before breakfast. 30 tablet 0   glipiZIDE (GLUCOTROL XL) 10 MG 24 hr tablet TAKE 1 TABLET (10 MG TOTAL) BY MOUTH DAILY WITH BREAKFAST. APPOINTMENT NEEDED FOR FURTHER REFILLS 30 tablet 0   glipizide-metformin (METAGLIP) 2.5-250 MG tablet glipizide-metformin Take No date recorded No form recorded No frequency recorded No route recorded No set duration recorded No set duration amount recorded active No dosage  strength recorded No dosage strength units of measure recorded     glucose blood (ONETOUCH VERIO) test strip Use as instructed once daily E11.9 100 each 12   irbesartan (AVAPRO) 300 MG tablet Take 1 tablet (300 mg total) by mouth daily. PATIENT NEEDS APPT PRIOR TO FUTURE REFILLS 90 tablet 1   Lancets MISC Use as directed once daily E11.9 100 each 3   metFORMIN (GLUCOPHAGE) 1000 MG tablet TAKE 1 TABLET BY MOUTH 2 TIMES DAILY WITH A MEAL. 180 tablet 3   No current facility-administered medications on file prior to visit.        ROS:  All others reviewed and negative.  Objective        PE:  BP 130/80 (BP Location: Right Arm, Patient Position: Sitting, Cuff Size: Normal)   Pulse 95   Temp 98 F (36.7 C) (Oral)   Ht 6' (1.829 m)   Wt 199 lb (90.3 kg)   SpO2 95%   BMI 26.99 kg/m                 Constitutional: Pt appears in NAD               HENT: Head: NCAT.                Right Ear: External ear normal.                 Left Ear: External ear normal.                Eyes: . Pupils are equal, round, and reactive to light. Conjunctivae and EOM are normal               Nose: without d/c or deformity               Neck: Neck supple. Gross normal ROM               Cardiovascular: Normal rate and regular rhythm.                 Pulmonary/Chest: Effort normal and breath sounds without rales or wheezing.                Abd:  Soft, NT, ND, + BS, no organomegaly               Neurological: Pt is alert. At baseline orientation, motor grossly intact               Skin: Skin is warm. No rashes, no other new lesions, LE edema - none               Psychiatric: Pt behavior is normal without agitation   Micro: none  Cardiac  tracings I have personally interpreted today:  none  Pertinent Radiological findings (summarize): none   Lab Results  Component Value Date   WBC 9.8 11/07/2020   HGB 17.5 (H) 11/07/2020   HCT 51.9 11/07/2020   PLT 286.0 11/07/2020   GLUCOSE 117 (H) 11/07/2020   CHOL 162  11/07/2020   TRIG 172.0 (H) 11/07/2020   HDL 39.00 (L) 11/07/2020   LDLDIRECT 105.0 10/30/2019   LDLCALC 89 11/07/2020   ALT 23 11/07/2020   AST 16 11/07/2020   NA 137 11/07/2020   K 4.9 11/07/2020   CL 103 11/07/2020   CREATININE 0.81 11/07/2020   BUN 19 11/07/2020   CO2 24 11/07/2020   TSH 2.67 11/07/2020   PSA 0.84 11/07/2020   INR 2.6 02/11/2009   HGBA1C 6.9 (A) 07/03/2021   MICROALBUR <0.7 11/07/2020   Assessment/Plan:  Bradley Henson is a 56 y.o. White or Caucasian [1] male with  has a past medical history of ANXIETY (07/17/2007), ASTHMA, UNSPECIFIED, UNSPECIFIED STATUS (10/27/2010), DIABETES MELLITUS, TYPE II (07/17/2007), ELEVATED BLOOD PRESSURE WITHOUT DIAGNOSIS OF HYPERTENSION (10/13/2007), GERD (57/08/7791), HERNIA, UMBILICAL (9/0/3009), HYPERLIPIDEMIA (07/17/2007), NEPHROLITHIASIS, HX OF (07/17/2007), PE (06/02/2008), Proteinuria (07/17/2007), PSA, INCREASED (10/03/2009), and SKIN LESION (08/04/2010).  Vitamin D deficiency Last vitamin D Lab Results  Component Value Date   VD25OH 19.42 (L) 11/07/2020   Low, reminded to start oral replacement d  Encounter for well adult exam with abnormal findings Age and sex appropriate education and counseling updated with regular exercise and diet Referrals for preventative services - declines colonoscopy for now Immunizations addressed -  for flu shot today, for tdap and shingrix at pharmacy Smoking counseling  - pt counsled to quit, pt not ready Evidence for depression or other mood disorder - none significant Most recent labs reviewed. I have personally reviewed and have noted: 1) the patient's medical and social history 2) The patient's current medications and supplements 3) The patient's height, weight, and BMI have been recorded in the chart   Diabetes [facility]) Lab Results  Component Value Date   HGBA1C 6.9 (A) 07/03/2021   With recent med non compliance, pt to continue current medical treatment jardiacne, 25 qd, glucoyrol  xl 10 qd, pt declines to continue metformin   HTN (hypertension) BP Readings from Last 3 Encounters:  08/23/22 130/80  07/03/21 126/80  11/07/20 (!) 142/80   Stable, pt to continue medical treatment avapro 300 qd, amlodipine 5 qd   Hyperlipidemia Lab Results  Component Value Date   LDLCALC 89 11/07/2020   Uncontrolled, pt to continue current statin lipitor 10 mg and f/u lab   Smoker Pt counseled to quit, pt not ready  Followup: Return in about 6 months (around 02/22/2023).  Cathlean Cower, MD 08/23/2022 8:27 PM [facility]

## 2022-08-23 NOTE — Assessment & Plan Note (Signed)
Lab Results  Component Value Date   HGBA1C 6.9 (A) 07/03/2021   With recent med non compliance, pt to continue current medical treatment jardiacne, 25 qd, glucoyrol xl 10 qd, pt declines to continue metformin

## 2022-08-23 NOTE — Assessment & Plan Note (Signed)
Pt counseled to quit, pt not ready 

## 2022-08-23 NOTE — Assessment & Plan Note (Signed)
BP Readings from Last 3 Encounters:  08/23/22 130/80  07/03/21 126/80  11/07/20 (!) 142/80   Stable, pt to continue medical treatment avapro 300 qd, amlodipine 5 qd

## 2022-08-23 NOTE — Assessment & Plan Note (Signed)
Lab Results  Component Value Date   LDLCALC 89 11/07/2020   Uncontrolled, pt to continue current statin lipitor 10 mg and f/u lab

## 2022-08-23 NOTE — Assessment & Plan Note (Signed)
Age and sex appropriate education and counseling updated with regular exercise and diet Referrals for preventative services - declines colonoscopy for now Immunizations addressed -  for flu shot today, for tdap and shingrix at pharmacy Smoking counseling  - pt counsled to quit, pt not ready Evidence for depression or other mood disorder - none significant Most recent labs reviewed. I have personally reviewed and have noted: 1) the patient's medical and social history 2) The patient's current medications and supplements 3) The patient's height, weight, and BMI have been recorded in the chart

## 2022-08-24 ENCOUNTER — Other Ambulatory Visit: Payer: Self-pay | Admitting: Internal Medicine

## 2022-08-24 LAB — VITAMIN D 25 HYDROXY (VIT D DEFICIENCY, FRACTURES): VITD: 15 ng/mL — ABNORMAL LOW (ref 30.00–100.00)

## 2022-08-24 LAB — VITAMIN B12: Vitamin B-12: 192 pg/mL — ABNORMAL LOW (ref 211–911)

## 2022-08-24 LAB — PSA: PSA: 1.18 ng/mL (ref 0.10–4.00)

## 2022-08-24 LAB — TSH: TSH: 1.52 u[IU]/mL (ref 0.35–5.50)

## 2022-08-24 MED ORDER — RYBELSUS 7 MG PO TABS: 7.0000 mg | ORAL_TABLET | Freq: Every day | ORAL | 3 refills | Status: DC

## 2022-08-24 MED ORDER — VITAMIN D3 50 MCG (2000 UT) PO CAPS: 2000.0000 [IU] | ORAL_CAPSULE | Freq: Every day | ORAL | 99 refills | Status: DC

## 2022-08-24 MED ORDER — VITAMIN B-12 1000 MCG PO TABS: 1000.0000 ug | ORAL_TABLET | Freq: Every day | ORAL | 1 refills | Status: DC

## 2022-08-24 MED ORDER — RYBELSUS 3 MG PO TABS: 3.0000 mg | ORAL_TABLET | Freq: Every day | ORAL | 0 refills | Status: DC

## 2022-08-24 MED ORDER — ATORVASTATIN CALCIUM 20 MG PO TABS: 20.0000 mg | ORAL_TABLET | Freq: Every day | ORAL | 3 refills | Status: DC

## 2022-08-25 ENCOUNTER — Other Ambulatory Visit: Payer: Self-pay | Admitting: Internal Medicine

## 2022-08-26 NOTE — Telephone Encounter (Signed)
Please refill as per office routine med refill policy (all routine meds to be refilled for 3 mo or monthly (per pt preference) up to one year from last visit, then month to month grace period for 3 mo, then further med refills will have to be denied) ? ?

## 2022-08-30 ENCOUNTER — Other Ambulatory Visit: Payer: Self-pay | Admitting: Internal Medicine

## 2022-08-30 NOTE — Telephone Encounter (Signed)
Please refill as per office routine med refill policy (all routine meds to be refilled for 3 mo or monthly (per pt preference) up to one year from last visit, then month to month grace period for 3 mo, then further med refills will have to be denied) ? ?

## 2022-09-07 ENCOUNTER — Other Ambulatory Visit: Payer: Self-pay

## 2022-09-07 ENCOUNTER — Other Ambulatory Visit: Payer: Self-pay | Admitting: Internal Medicine

## 2022-09-07 NOTE — Telephone Encounter (Signed)
Please refill as per office routine med refill policy (all routine meds to be refilled for 3 mo or monthly (per pt preference) up to one year from last visit, then month to month grace period for 3 mo, then further med refills will have to be denied)

## 2022-09-11 ENCOUNTER — Other Ambulatory Visit: Payer: Self-pay | Admitting: Internal Medicine

## 2022-09-21 ENCOUNTER — Other Ambulatory Visit: Payer: Self-pay | Admitting: Internal Medicine

## 2022-09-21 NOTE — Telephone Encounter (Signed)
Please refill as per office routine med refill policy (all routine meds to be refilled for 3 mo or monthly (per pt preference) up to one year from last visit, then month to month grace period for 3 mo, then further med refills will have to be denied)

## 2022-11-03 ENCOUNTER — Other Ambulatory Visit: Payer: Self-pay | Admitting: Internal Medicine

## 2022-12-07 ENCOUNTER — Other Ambulatory Visit: Payer: Self-pay | Admitting: Internal Medicine

## 2022-12-08 ENCOUNTER — Other Ambulatory Visit: Payer: Self-pay | Admitting: Internal Medicine

## 2023-01-12 ENCOUNTER — Other Ambulatory Visit: Payer: Self-pay | Admitting: Internal Medicine

## 2023-02-05 ENCOUNTER — Telehealth: Payer: Self-pay | Admitting: Internal Medicine

## 2023-02-05 NOTE — Telephone Encounter (Signed)
Prescription Request  02/05/2023  LOV: 08/23/2022  What is the name of the medication or equipment? Jaridance, amlodopine, atorvastatin,   Have you contacted your pharmacy to request a refill? Yes   Which pharmacy would you like this sent to?  [facility] in [place] [address] in [place]   Patient notified that their request is being sent to the clinical staff for review and that they should receive a response within 2 business days.   Please advise at Mobile [phone] (mobile)    PLEASE NOTE PHARMACY HAS CHANGED

## 2023-02-06 MED ORDER — AMLODIPINE BESYLATE 5 MG PO TABS: 5.0000 mg | ORAL_TABLET | Freq: Every day | ORAL | 1 refills | Status: DC

## 2023-02-06 MED ORDER — ATORVASTATIN CALCIUM 20 MG PO TABS: 20.0000 mg | ORAL_TABLET | Freq: Every day | ORAL | 1 refills | Status: DC

## 2023-02-06 MED ORDER — EMPAGLIFLOZIN 25 MG PO TABS: 25.0000 mg | ORAL_TABLET | Freq: Every day | ORAL | 1 refills | Status: DC

## 2023-02-06 NOTE — Telephone Encounter (Signed)
Refills sent to [facility]b

## 2023-02-13 ENCOUNTER — Telehealth: Payer: Self-pay | Admitting: Internal Medicine

## 2023-02-13 NOTE — Telephone Encounter (Signed)
Prescription Request  02/13/2023  LOV: 08/23/2022  What is the name of the medication or equipment?  irbesartan (AVAPRO) 300 MG tablet  Have you contacted your pharmacy to request a refill? No   Which pharmacy would you like this sent to?  [id number] - [place], [place] - [address] AT [facility] [address] [facility] [address] [place] [address] Phone: [phone] Fax: [phone]    Patient notified that their request is being sent to the clinical staff for review and that they should receive a response within 2 business days.   Please advise at Mobile [phone] (mobile)

## 2023-02-13 NOTE — Telephone Encounter (Signed)
Not due for refill

## 2023-02-13 NOTE — Telephone Encounter (Signed)
Patient said he is out of his medication. He would like a call back at [phone].

## 2023-03-04 ENCOUNTER — Other Ambulatory Visit: Payer: Self-pay

## 2023-03-04 ENCOUNTER — Other Ambulatory Visit: Payer: Self-pay | Admitting: Internal Medicine

## 2023-03-08 ENCOUNTER — Other Ambulatory Visit: Payer: BC Managed Care – PPO

## 2023-03-08 DIAGNOSIS — E538 Deficiency of other specified B group vitamins: Secondary | ICD-10-CM

## 2023-03-08 DIAGNOSIS — E559 Vitamin D deficiency, unspecified: Secondary | ICD-10-CM

## 2023-03-08 DIAGNOSIS — I10 Essential (primary) hypertension: Secondary | ICD-10-CM

## 2023-03-08 DIAGNOSIS — E7800 Pure hypercholesterolemia, unspecified: Secondary | ICD-10-CM

## 2023-03-08 DIAGNOSIS — E1165 Type 2 diabetes mellitus with hyperglycemia: Secondary | ICD-10-CM

## 2023-03-09 LAB — CMP14+EGFR
ALT: 24 IU/L (ref 0–44)
AST: 15 IU/L (ref 0–40)
Albumin: 4.8 g/dL (ref 3.8–4.9)
Alkaline Phosphatase: 101 IU/L (ref 44–121)
BUN/Creatinine Ratio: 26 — ABNORMAL HIGH (ref 9–20)
BUN: 25 mg/dL — ABNORMAL HIGH (ref 6–24)
Bilirubin Total: 0.2 mg/dL (ref 0.0–1.2)
CO2: 21 mmol/L (ref 20–29)
Calcium: 10.4 mg/dL — ABNORMAL HIGH (ref 8.7–10.2)
Chloride: 100 mmol/L (ref 96–106)
Creatinine, Ser: 0.98 mg/dL (ref 0.76–1.27)
Globulin, Total: 2.4 g/dL (ref 1.5–4.5)
Glucose: 193 mg/dL — ABNORMAL HIGH (ref 70–99)
Potassium: 4.8 mmol/L (ref 3.5–5.2)
Sodium: 141 mmol/L (ref 134–144)
Total Protein: 7.2 g/dL (ref 6.0–8.5)
eGFR: 91 mL/min/{1.73_m2} (ref >59)

## 2023-03-09 LAB — LIPID PANEL
Chol/HDL Ratio: 3 ratio (ref 0.0–5.0)
Cholesterol, Total: 121 mg/dL (ref 100–199)
HDL: 40 mg/dL (ref >39)
LDL Chol Calc (NIH): 42 mg/dL (ref 0–99)
Triglycerides: 250 mg/dL — ABNORMAL HIGH (ref 0–149)
VLDL Cholesterol Cal: 39 mg/dL (ref 5–40)

## 2023-03-09 LAB — CBC WITH DIFFERENTIAL
Basophils Absolute: 0.1 10*3/uL (ref 0.0–0.2)
Basos: 1 %
EOS (ABSOLUTE): 0.3 10*3/uL (ref 0.0–0.4)
Eos: 3 %
Hematocrit: 50.3 % (ref 37.5–51.0)
Hemoglobin: 17.1 g/dL (ref 13.0–17.7)
Immature Grans (Abs): 0 10*3/uL (ref 0.0–0.1)
Immature Granulocytes: 0 %
Lymphocytes Absolute: 2.5 10*3/uL (ref 0.7–3.1)
Lymphs: 24 %
MCH: 32.1 pg (ref 26.6–33.0)
MCHC: 34 g/dL (ref 31.5–35.7)
MCV: 94 fL (ref 79–97)
Monocytes Absolute: 0.9 10*3/uL (ref 0.1–0.9)
Monocytes: 9 %
Neutrophils Absolute: 6.4 10*3/uL (ref 1.4–7.0)
Neutrophils: 63 %
RBC: 5.33 x10E6/uL (ref 4.14–5.80)
RDW: 12.7 % (ref 11.6–15.4)
WBC: 10.2 10*3/uL (ref 3.4–10.8)

## 2023-03-09 LAB — HEMOGLOBIN A1C
Est. average glucose Bld gHb Est-mCnc: 171 mg/dL
Hgb A1c MFr Bld: 7.6 % — ABNORMAL HIGH (ref 4.8–5.6)

## 2023-03-09 LAB — VITAMIN D 25 HYDROXY (VIT D DEFICIENCY, FRACTURES): Vit D, 25-Hydroxy: 18.4 ng/mL — ABNORMAL LOW (ref 30.0–100.0)

## 2023-03-09 LAB — VITAMIN B12: Vitamin B-12: 1636 pg/mL — ABNORMAL HIGH (ref 232–1245)

## 2023-03-09 LAB — TSH: TSH: 1.03 u[IU]/mL (ref 0.450–4.500)

## 2023-03-11 ENCOUNTER — Encounter: Payer: Self-pay | Admitting: Family

## 2023-03-11 ENCOUNTER — Ambulatory Visit: Payer: BC Managed Care – PPO | Admitting: Family

## 2023-03-11 VITALS — BP 124/80 | HR 101 | Ht 73.0 in | Wt 194.0 lb

## 2023-03-11 DIAGNOSIS — E782 Mixed hyperlipidemia: Secondary | ICD-10-CM

## 2023-03-11 DIAGNOSIS — R030 Elevated blood-pressure reading, without diagnosis of hypertension: Secondary | ICD-10-CM

## 2023-03-11 DIAGNOSIS — R972 Elevated prostate specific antigen [PSA]: Secondary | ICD-10-CM

## 2023-03-11 DIAGNOSIS — I10 Essential (primary) hypertension: Secondary | ICD-10-CM

## 2023-03-11 DIAGNOSIS — E1165 Type 2 diabetes mellitus with hyperglycemia: Secondary | ICD-10-CM | POA: Diagnosis not present

## 2023-03-11 DIAGNOSIS — K429 Umbilical hernia without obstruction or gangrene: Secondary | ICD-10-CM

## 2023-03-11 LAB — POCT CBG (FASTING - GLUCOSE)-MANUAL ENTRY: Glucose Fasting, POC: 187 mg/dL — AB (ref 70–99)

## 2023-03-11 MED ORDER — MOUNJARO 2.5 MG/0.5ML ~~LOC~~ SOAJ: 2.5000 mg | SUBCUTANEOUS | 0 refills | Status: DC

## 2023-03-11 MED ORDER — ATORVASTATIN CALCIUM 20 MG PO TABS: 20.0000 mg | ORAL_TABLET | Freq: Every day | ORAL | 1 refills | Status: DC

## 2023-03-11 MED ORDER — ALBUTEROL SULFATE HFA 108 (90 BASE) MCG/ACT IN AERS: 1.0000 | INHALATION_SPRAY | Freq: Four times a day (QID) | RESPIRATORY_TRACT | 6 refills | Status: DC | PRN

## 2023-03-11 MED ORDER — AMLODIPINE BESYLATE 5 MG PO TABS: 5.0000 mg | ORAL_TABLET | Freq: Every day | ORAL | 1 refills | Status: DC

## 2023-03-11 MED ORDER — VITAMIN D3 50 MCG (2000 UT) PO CAPS: 2000.0000 [IU] | ORAL_CAPSULE | Freq: Every day | ORAL | 99 refills | Status: AC

## 2023-03-11 MED ORDER — VITAMIN B-12 1000 MCG PO TABS: 1000.0000 ug | ORAL_TABLET | Freq: Every day | ORAL | 1 refills | Status: AC

## 2023-03-11 MED ORDER — IRBESARTAN 150 MG PO TABS: 150.0000 mg | ORAL_TABLET | Freq: Every day | ORAL | 1 refills | Status: DC

## 2023-03-11 MED ORDER — GLIPIZIDE ER 10 MG PO TB24: 10.0000 mg | ORAL_TABLET | Freq: Every day | ORAL | 1 refills | Status: DC

## 2023-03-11 MED ORDER — EMPAGLIFLOZIN 25 MG PO TABS: 25.0000 mg | ORAL_TABLET | Freq: Every day | ORAL | 1 refills | Status: DC

## 2023-03-11 MED ORDER — METFORMIN HCL 1000 MG PO TABS: 1000.0000 mg | ORAL_TABLET | Freq: Two times a day (BID) | ORAL | 3 refills | Status: DC

## 2023-03-11 MED ORDER — ONETOUCH VERIO VI STRP: ORAL_STRIP | 12 refills | Status: AC

## 2023-03-11 NOTE — Progress Notes (Signed)
New Patient Office Visit  Subjective    Patient ID: Bradley Henson, male    DOB: 04-12-1966  Age: 57 y.o. MRN: [id number]  CC:  Chief Complaint  Patient presents with   Establish Care    New Patient   New Patient (Initial Visit)    New Patient    HPI KEVAN PROUTY presents to establish care Previous Primary Care provider/office:  Dr. Jonny Ruiz.  he does have additional concerns to discuss today.   He had labs drawn last week, so we will discuss in detail today.  Also needs refills on his meds.   No other concerns today.    Outpatient Encounter Medications as of 03/11/2023  Medication Sig   aspirin 81 MG tablet Take 81 mg by mouth daily.   irbesartan (AVAPRO) 150 MG tablet Take 1 tablet (150 mg total) by mouth daily.   tirzepatide [facility]) 2.5 MG/0.5ML Pen Inject 2.5 mg into the skin once a week.   [DISCONTINUED] albuterol (VENTOLIN HFA) 108 (90 Base) MCG/ACT inhaler TAKE 2 PUFFS BY MOUTH EVERY 6 HOURS AS NEEDED FOR WHEEZE OR SHORTNESS OF BREATH   [DISCONTINUED] amLODipine (NORVASC) 5 MG tablet Take 1 tablet (5 mg total) by mouth daily.   [DISCONTINUED] atorvastatin (LIPITOR) 20 MG tablet Take 1 tablet (20 mg total) by mouth daily.   [DISCONTINUED] Cholecalciferol (VITAMIN D3) 50 MCG (2000 UT) capsule Take 1 capsule (2,000 Units total) by mouth daily.   [DISCONTINUED] cyanocobalamin (VITAMIN B12) 1000 MCG tablet Take 1 tablet (1,000 mcg total) by mouth daily.   [DISCONTINUED] empagliflozin (JARDIANCE) 25 MG TABS tablet Take 1 tablet (25 mg total) by mouth daily before breakfast.   [DISCONTINUED] glipiZIDE (GLUCOTROL XL) 10 MG 24 hr tablet TAKE 1 TABLET (10 MG TOTAL) BY MOUTH DAILY WITH BREAKFAST. APPOINTMENT NEEDED FOR FURTHER REFILLS   [DISCONTINUED] irbesartan (AVAPRO) 300 MG tablet TAKE 1 TABLET (300 MG TOTAL) BY MOUTH DAILY. PATIENT NEEDS APPT PRIOR TO FUTURE REFILLS   [DISCONTINUED] metFORMIN (GLUCOPHAGE) 1000 MG tablet TAKE 1 TABLET BY MOUTH 2 TIMES DAILY WITH A MEAL.    albuterol (VENTOLIN HFA) 108 (90 Base) MCG/ACT inhaler Inhale 1-2 puffs into the lungs every 6 (six) hours as needed for wheezing or shortness of breath.   amLODipine (NORVASC) 5 MG tablet Take 1 tablet (5 mg total) by mouth daily.   atorvastatin (LIPITOR) 20 MG tablet Take 1 tablet (20 mg total) by mouth daily.   Blood Glucose Monitoring Suppl (ONETOUCH VERIO) w/Device KIT Apply 1 Device topically daily. E11.9 (Patient not taking: Reported on 03/11/2023)   Cholecalciferol (VITAMIN D3) 50 MCG (2000 UT) capsule Take 1 capsule (2,000 Units total) by mouth daily.   cyanocobalamin (VITAMIN B12) 1000 MCG tablet Take 1 tablet (1,000 mcg total) by mouth daily.   empagliflozin (JARDIANCE) 25 MG TABS tablet Take 1 tablet (25 mg total) by mouth daily before breakfast.   glipiZIDE (GLUCOTROL XL) 10 MG 24 hr tablet Take 1 tablet (10 mg total) by mouth daily with breakfast. Appointment needed for further refills   glucose blood (ONETOUCH VERIO) test strip Use as instructed once daily E11.9   Lancets MISC Use as directed once daily E11.9   metFORMIN (GLUCOPHAGE) 1000 MG tablet Take 1 tablet (1,000 mg total) by mouth 2 (two) times daily with a meal.   [DISCONTINUED] glucose blood (ONETOUCH VERIO) test strip Use as instructed once daily E11.9 (Patient not taking: Reported on 03/11/2023)   [DISCONTINUED] Semaglutide (RYBELSUS) 3 MG TABS Take 3 mg by mouth  daily. (Patient not taking: Reported on 03/11/2023)   [DISCONTINUED] Semaglutide (RYBELSUS) 7 MG TABS Take 7 mg by mouth daily. (Patient not taking: Reported on 03/11/2023)   No facility-administered encounter medications on file as of 03/11/2023.    Past Medical History:  Diagnosis Date   ANXIETY 07/17/2007   ASTHMA, UNSPECIFIED, UNSPECIFIED STATUS 10/27/2010   DIABETES MELLITUS, TYPE II 07/17/2007   ELEVATED BLOOD PRESSURE WITHOUT DIAGNOSIS OF HYPERTENSION 10/13/2007   GERD 07/17/2007   HERNIA, UMBILICAL 10/27/2010   HYPERLIPIDEMIA 07/17/2007    NEPHROLITHIASIS, HX OF 07/17/2007   PE 06/02/2008   2 in lungs and 1 in left arm, spontaneous   Proteinuria 07/17/2007   PSA, INCREASED 10/03/2009   SKIN LESION 08/04/2010   right leg    Umbilical hernia 04/05/2017    Past Surgical History:  Procedure Laterality Date   inguinal herniorrhapy  1980s   LIPOMA EXCISION Left 10/02/2019   Procedure: EXCISION LIPOMA OF LEFT CHEST WALL;  Surgeon: Griselda Miner, MD;  Location: [facility];  Service: General;  Laterality: Left;   s/p nasal surgury and palatoplasty  1980s   UMBILICAL HERNIA REPAIR N/A 10/02/2019   Procedure: UMBILICAL HERNIA REPAIR WITH MESH;  Surgeon: Griselda Miner, MD;  Location: [facility];  Service: General;  Laterality: N/A;    Family History  Problem Relation Age of Onset   Diabetes Other     Social History   Socioeconomic History   Marital status: Married    Spouse name: Not on file   Number of children: 1   Years of education: Not on file   Highest education level: Not on file  Occupational History   Occupation: truck driver long distance    Employer: [employer]  Tobacco Use   Smoking status: Every Day   Smokeless tobacco: Never  Substance and Sexual Activity   Alcohol use: No   Drug use: No   Sexual activity: Not on file  Other Topics Concern   Not on file  Social History Narrative   Not on file   Social Determinants of Health   Financial Resource Strain: Not on file  Food Insecurity: Not on file  Transportation Needs: Not on file  Physical Activity: Not on file  Stress: Not on file  Social Connections: Not on file  Intimate Partner Violence: Not on file    Review of Systems  All other systems reviewed and are negative.       Objective    BP 124/80   Pulse (!) 101   Ht 6\' 1"  (1.854 m)   Wt 194 lb (88 kg)   SpO2 96%   BMI 25.60 kg/m   Physical Exam Vitals and nursing note reviewed.  Constitutional:      Appearance: Normal appearance. He is normal  weight.  HENT:     Head: Normocephalic and atraumatic.  Eyes:     Extraocular Movements: Extraocular movements intact.     Conjunctiva/sclera: Conjunctivae normal.     Pupils: Pupils are equal, round, and reactive to light.  Cardiovascular:     Rate and Rhythm: Normal rate.     Pulses: Normal pulses.  Pulmonary:     Effort: Pulmonary effort is normal.     Breath sounds: Normal breath sounds.  Musculoskeletal:        General: Normal range of motion.  Neurological:     General: No focal deficit present.     Mental Status: He is alert and oriented to person,  place, and time. Mental status is at baseline.  Psychiatric:        Mood and Affect: Mood normal.        Behavior: Behavior normal.        Assessment & Plan:   Problem List Items Addressed This Visit       Active Problems   Type 2 diabetes mellitus with hyperglycemia (HCC) - Primary    A1C was slightly elevated, will continue current medications and add Mounjaro to regimen.   Recheck labs in 3 months.  Adjust meds if needed based on labs.        Relevant Medications   irbesartan (AVAPRO) 150 MG tablet   tirzepatide (MOUNJARO) 2.5 MG/0.5ML Pen   metFORMIN (GLUCOPHAGE) 1000 MG tablet   glipiZIDE (GLUCOTROL XL) 10 MG 24 hr tablet   atorvastatin (LIPITOR) 20 MG tablet   empagliflozin (JARDIANCE) 25 MG TABS tablet   Other Relevant Orders   POCT CBG (Fasting - Glucose) (Completed)   Hyperlipidemia    Checking labs today.  Continue current therapy for lipid control. Will modify as needed based on labwork results.       Relevant Medications   irbesartan (AVAPRO) 150 MG tablet   atorvastatin (LIPITOR) 20 MG tablet   amLODipine (NORVASC) 5 MG tablet   Essential hypertension, benign    Blood pressure well controlled with current medications.  Continue current therapy.  Will reassess at follow up.       Relevant Medications   irbesartan (AVAPRO) 150 MG tablet   atorvastatin (LIPITOR) 20 MG tablet   amLODipine  (NORVASC) 5 MG tablet     Resolved Problems   RESOLVED: PSA, INCREASED   RESOLVED: ELEVATED BLOOD PRESSURE WITHOUT DIAGNOSIS OF HYPERTENSION   RESOLVED: Umbilical hernia    Patient had surgery to repair in 2021 - moving to history.         Return in about 3 months (around 06/11/2023).   Total time spent: 30 minutes  Miki Kins, FNP  03/11/2023  This document may have been prepared by [facility] Voice Recognition software and as such may include unintentional dictation errors.

## 2023-03-23 ENCOUNTER — Encounter: Payer: Self-pay | Admitting: Family

## 2023-03-23 NOTE — Assessment & Plan Note (Signed)
Checking labs today.  Continue current therapy for lipid control. Will modify as needed based on labwork results.  

## 2023-03-23 NOTE — Assessment & Plan Note (Signed)
Blood pressure well controlled with current medications.  Continue current therapy.  Will reassess at follow up.  

## 2023-03-23 NOTE — Assessment & Plan Note (Signed)
A1C was slightly elevated, will continue current medications and add Mounjaro to regimen.   Recheck labs in 3 months.  Adjust meds if needed based on labs.

## 2023-03-23 NOTE — Assessment & Plan Note (Signed)
Patient had surgery to repair in 2021 - moving to history.

## 2023-08-04 ENCOUNTER — Telehealth: Payer: Self-pay | Admitting: Family Medicine

## 2023-08-04 DIAGNOSIS — R1012 Left upper quadrant pain: Secondary | ICD-10-CM

## 2023-08-04 NOTE — Patient Instructions (Signed)
Muscle Strain A muscle strain is an injury that occurs when a muscle is stretched beyond its normal length. Usually, a small number of muscle fibers are torn when this happens. There are three types of muscle strains. First-degree strains have the least amount of muscle fiber tearing and the least amount of pain. Second-degree and third-degree strains have more tearing and pain. Usually, recovery from muscle strain takes 1-2 weeks. Complete healing normally takes 5-6 weeks. What are the causes? This condition is caused when a sudden, violent force is placed on a muscle and stretches it too far. This may occur with a fall, while lifting, or during sports. What increases the risk? This condition is more likely to develop in athletes and people who are physically active. What are the signs or symptoms? Symptoms of this condition include: Pain. Tenderness. Bruising. Swelling. Trouble using the muscle. How is this diagnosed? This condition is diagnosed based on a physical exam and your medical history. Tests may also be done, including an X-ray, ultrasound, or MRI. How is this treated? This condition is initially treated with PRICE therapy. This therapy involves: Protecting the muscle from being injured again. Resting the injured muscle. Icing the injured muscle. Applying pressure (compression) to the injured muscle. This may be done with a splint or elastic bandage. Raising (elevating) the injured muscle. Your health care provider may also recommend medicine for pain. Follow these instructions at home: If you have a removable splint: Wear the splint as told by your health care provider. Remove it only as told by your health care provider. Check the skin around the splint every day. Tell your health care provider about any concerns. Loosen the splint if your fingers or toes tingle, become numb, or turn cold and blue. Keep the splint clean. If the splint is not waterproof: Do not let it get  wet. Cover it with a watertight covering when you take a bath or a shower. Managing pain, stiffness, and swelling  If directed, put ice on the injured area. To do this: If you have a removable splint, remove it as told by your health care provider. Put ice in a plastic bag. Place a towel between your skin and the bag. Leave the ice on for 20 minutes, 2-3 times a day. Remove the ice if your skin turns bright red. This is very important. If you cannot feel pain, heat, or cold, you have a greater risk of damage to the area. Move your fingers or toes often to reduce stiffness and swelling. Raise (elevate) the injured area above the level of your heart while you are sitting or lying down. Wear an elastic bandage as told by your health care provider. Make sure that it is not too tight. General instructions Take over-the-counter and prescription medicines only as told by your health care provider. Treatment may include muscle relaxants or medicines for pain and inflammation that are taken by mouth or applied to the skin. Restrict your activity and rest the injured muscle as told by your health care provider. Gentle movements may be allowed. If physical therapy was prescribed, do exercises as told by your health care provider. Do not put pressure on any part of the splint until it is fully hardened. This may take several hours. Do not use any products that contain nicotine or tobacco. These products include cigarettes, chewing tobacco, and vaping devices, such as e-cigarettes. If you need help quitting, ask your health care provider. Ask your health care provider when it is  safe to drive if you have a splint. Keep all follow-up visits. This is important. How is this prevented? Warm up before exercising. This helps to prevent future muscle strains. Contact a health care provider if: You have more pain or swelling in the injured area. Get help right away if: You have numbness or tingling in the  injured area. You lose a lot of strength in the injured area. Summary A muscle strain is an injury that occurs when a muscle is stretched beyond its normal length. This condition is caused when a sudden, violent force is placed on a muscle and stretches it too far. This condition is initially treated with PRICE therapy, which involves protecting, resting, icing, compressing, and elevating. Gentle movements may be allowed. If physical therapy was prescribed, do exercises as told by your health care provider. This information is not intended to replace advice given to you by your health care provider. Make sure you discuss any questions you have with your health care provider. Document Revised: 12/17/2022 Document Reviewed: 10/31/2020 Elsevier Patient Education  2024 [employer].

## 2023-08-04 NOTE — Progress Notes (Signed)
Virtual Visit Consent   Bradley Henson, you are scheduled for a virtual visit with a [facility] provider today. Just as with appointments in the office, your consent must be obtained to participate. Your consent will be active for this visit and any virtual visit you may have with one of our providers in the next 365 days. If you have a MyChart account, a copy of this consent can be sent to you electronically.  As this is a virtual visit, video technology does not allow for your provider to perform a traditional examination. This may limit your provider's ability to fully assess your condition. If your provider identifies any concerns that need to be evaluated in person or the need to arrange testing (such as labs, EKG, etc.), we will make arrangements to do so. Although advances in technology are sophisticated, we cannot ensure that it will always work on either your end or our end. If the connection with a video visit is poor, the visit may have to be switched to a telephone visit. With either a video or telephone visit, we are not always able to ensure that we have a secure connection.  By engaging in this virtual visit, you consent to the provision of healthcare and authorize for your insurance to be billed (if applicable) for the services provided during this visit. Depending on your insurance coverage, you may receive a charge related to this service.  I need to obtain your verbal consent now. Are you willing to proceed with your visit today? Bradley Henson has provided verbal consent on 08/04/2023 for a virtual visit (video or telephone). Georgana Curio, FNP  Date: 08/04/2023 4:18 PM  Virtual Visit via Video Note   I, Georgana Curio, connected with  Bradley Henson  ([id number], [birth year]) on 08/04/23 at  4:15 PM EST by a video-enabled telemedicine application and verified that I am speaking with the correct person using two identifiers.  Location: Patient: Virtual Visit Location Patient:  Home Provider: Virtual Visit Location Provider: Home Office   I discussed the limitations of evaluation and management by telemedicine and the availability of in person appointments. The patient expressed understanding and agreed to proceed.    History of Present Illness: Bradley Henson is a 57 y.o. who identifies as a male who was assigned male at birth, and is being seen today for pain in LUQ abdomen around flank. No nausea, vomiting or diarrhea. No fever. No history of kidney stones. He says its been there for quite a while and he pulled a muscle. Turning worsens pain. Ibuprofen helps. He is in no distress.   HPI: HPI  Problems:  Patient Active Problem List   Diagnosis Date Noted   Vitamin D deficiency 11/07/2020   Smoker 04/27/2019   Lipoma 04/05/2017   Essential hypertension, benign 04/05/2017   Asthma 10/27/2010   Skin lesion 08/04/2010   Type 2 diabetes mellitus with hyperglycemia (HCC) 07/17/2007   Hyperlipidemia 07/17/2007   Anxiety state 07/17/2007   GERD 07/17/2007   Proteinuria 07/17/2007    Allergies: No Known Allergies Medications:  Current Outpatient Medications:    albuterol (VENTOLIN HFA) 108 (90 Base) MCG/ACT inhaler, Inhale 1-2 puffs into the lungs every 6 (six) hours as needed for wheezing or shortness of breath., Disp: 8.5 each, Rfl: 6   amLODipine (NORVASC) 5 MG tablet, Take 1 tablet (5 mg total) by mouth daily., Disp: 90 tablet, Rfl: 1   aspirin 81 MG tablet, Take 81 mg by mouth daily., Disp: ,  Rfl:    atorvastatin (LIPITOR) 20 MG tablet, Take 1 tablet (20 mg total) by mouth daily., Disp: 90 tablet, Rfl: 1   Blood Glucose Monitoring Suppl (ONETOUCH VERIO) w/Device KIT, Apply 1 Device topically daily. E11.9 (Patient not taking: Reported on 03/11/2023), Disp: 1 kit, Rfl: 0   Cholecalciferol (VITAMIN D3) 50 MCG (2000 UT) capsule, Take 1 capsule (2,000 Units total) by mouth daily., Disp: 100 capsule, Rfl: 99   cyanocobalamin (VITAMIN B12) 1000 MCG tablet, Take 1  tablet (1,000 mcg total) by mouth daily., Disp: 100 tablet, Rfl: 1   empagliflozin (JARDIANCE) 25 MG TABS tablet, Take 1 tablet (25 mg total) by mouth daily before breakfast., Disp: 90 tablet, Rfl: 1   glipiZIDE (GLUCOTROL XL) 10 MG 24 hr tablet, Take 1 tablet (10 mg total) by mouth daily with breakfast. Appointment needed for further refills, Disp: 90 tablet, Rfl: 1   glucose blood (ONETOUCH VERIO) test strip, Use as instructed once daily E11.9, Disp: 100 each, Rfl: 12   irbesartan (AVAPRO) 150 MG tablet, Take 1 tablet (150 mg total) by mouth daily., Disp: 90 tablet, Rfl: 1   Lancets MISC, Use as directed once daily E11.9, Disp: 100 each, Rfl: 3   metFORMIN (GLUCOPHAGE) 1000 MG tablet, Take 1 tablet (1,000 mg total) by mouth 2 (two) times daily with a meal., Disp: 180 tablet, Rfl: 3   tirzepatide (MOUNJARO) 2.5 MG/0.5ML Pen, Inject 2.5 mg into the skin once a week., Disp: 2 mL, Rfl: 0  Observations/Objective: Patient is well-developed, well-nourished in no acute distress.  Resting comfortably  at home.  Head is normocephalic, atraumatic.  No labored breathing.  Speech is clear and coherent with logical content.  Patient is alert and oriented at baseline.    Assessment and Plan: 1. Left upper quadrant abdominal pain  Heat, ED if sx persist or worsen. Continue ibuprofen as directed.   Follow Up Instructions: I discussed the assessment and treatment plan with the patient. The patient was provided an opportunity to ask questions and all were answered. The patient agreed with the plan and demonstrated an understanding of the instructions.  A copy of instructions were sent to the patient via MyChart unless otherwise noted below.     The patient was advised to call back or seek an in-person evaluation if the symptoms worsen or if the condition fails to improve as anticipated.    Georgana Curio, FNP

## 2023-08-19 ENCOUNTER — Other Ambulatory Visit: Payer: Self-pay | Admitting: Family

## 2023-11-15 ENCOUNTER — Other Ambulatory Visit: Payer: Self-pay

## 2023-11-15 MED ORDER — ALBUTEROL SULFATE HFA 108 (90 BASE) MCG/ACT IN AERS
1.0000 | INHALATION_SPRAY | Freq: Four times a day (QID) | RESPIRATORY_TRACT | 6 refills | Status: DC | PRN
Start: 2023-11-15 — End: 2024-07-02

## 2023-11-15 MED ORDER — ATORVASTATIN CALCIUM 20 MG PO TABS: 20.0000 mg | ORAL_TABLET | Freq: Every day | ORAL | 1 refills | Status: DC

## 2023-11-15 MED ORDER — AMLODIPINE BESYLATE 5 MG PO TABS: 5.0000 mg | ORAL_TABLET | Freq: Every day | ORAL | 1 refills | Status: DC

## 2023-11-22 ENCOUNTER — Other Ambulatory Visit: Payer: Self-pay | Admitting: Family

## 2023-12-09 ENCOUNTER — Telehealth: Payer: Self-pay

## 2023-12-09 ENCOUNTER — Other Ambulatory Visit: Payer: Self-pay

## 2023-12-09 MED ORDER — DAPAGLIFLOZIN PROPANEDIOL 10 MG PO TABS: 10.0000 mg | ORAL_TABLET | Freq: Every day | ORAL | 3 refills | Status: DC

## 2023-12-09 NOTE — Telephone Encounter (Signed)
 Patient called and stating that he's getting a rash from taking the Jardiance can we switch him to something else?

## 2023-12-09 NOTE — Telephone Encounter (Signed)
 Sent in farxiga for pt per [employer]

## 2024-01-03 ENCOUNTER — Ambulatory Visit (INDEPENDENT_AMBULATORY_CARE_PROVIDER_SITE_OTHER): Admitting: Family

## 2024-01-03 VITALS — BP 148/84 | HR 93 | Ht 73.0 in | Wt 200.0 lb

## 2024-01-03 DIAGNOSIS — E559 Vitamin D deficiency, unspecified: Secondary | ICD-10-CM

## 2024-01-03 DIAGNOSIS — I10 Essential (primary) hypertension: Secondary | ICD-10-CM | POA: Diagnosis not present

## 2024-01-03 DIAGNOSIS — E782 Mixed hyperlipidemia: Secondary | ICD-10-CM | POA: Diagnosis not present

## 2024-01-03 DIAGNOSIS — E1165 Type 2 diabetes mellitus with hyperglycemia: Secondary | ICD-10-CM

## 2024-01-03 DIAGNOSIS — Z1211 Encounter for screening for malignant neoplasm of colon: Secondary | ICD-10-CM

## 2024-01-03 DIAGNOSIS — E7800 Pure hypercholesterolemia, unspecified: Secondary | ICD-10-CM | POA: Diagnosis not present

## 2024-01-03 DIAGNOSIS — R972 Elevated prostate specific antigen [PSA]: Secondary | ICD-10-CM

## 2024-01-03 DIAGNOSIS — E538 Deficiency of other specified B group vitamins: Secondary | ICD-10-CM

## 2024-01-03 LAB — POC CREATINE & ALBUMIN,URINE
Creatinine, POC: 100 mg/dL
Microalbumin Ur, POC: 80 mg/L

## 2024-01-03 NOTE — Progress Notes (Signed)
 Established Patient Office Visit  Subjective:  Patient ID: Bradley Henson, male    DOB: [birth year]  Age: 58 y.o. MRN: [id number]  Chief Complaint  Patient presents with   Follow-up    Follow Up     Patient is here today for his 6 months follow up.  He has been feeling fairly well since last appointment.   He does not have additional concerns to discuss today.  Labs were done prior to appointment, will discuss in detail today.  He needs refills.   I have reviewed his active problem list, medication list, allergies, notes from last encounter, lab results for his appointment today.      No other concerns at this time.   Past Medical History:  Diagnosis Date   ANXIETY 07/17/2007   ASTHMA, UNSPECIFIED, UNSPECIFIED STATUS 10/27/2010   DIABETES MELLITUS, TYPE II 07/17/2007   ELEVATED BLOOD PRESSURE WITHOUT DIAGNOSIS OF HYPERTENSION 10/13/2007   GERD 07/17/2007   HERNIA, UMBILICAL 10/27/2010   HYPERLIPIDEMIA 07/17/2007   NEPHROLITHIASIS, HX OF 07/17/2007   PE 06/02/2008   2 in lungs and 1 in left arm, spontaneous   Proteinuria 07/17/2007   PSA, INCREASED 10/03/2009   SKIN LESION 08/04/2010   right leg    Umbilical hernia 04/05/2017    Past Surgical History:  Procedure Laterality Date   inguinal herniorrhapy  1980s   LIPOMA EXCISION Left 10/02/2019   Procedure: EXCISION LIPOMA OF LEFT CHEST WALL;  Surgeon: Curvin Deward MOULD, MD;  Location: [facility];  Service: General;  Laterality: Left;   s/p nasal surgury and palatoplasty  1980s   UMBILICAL HERNIA REPAIR N/A 10/02/2019   Procedure: UMBILICAL HERNIA REPAIR WITH MESH;  Surgeon: Curvin Deward MOULD, MD;  Location:  S[facility]ERY [facility];  Service: General;  Laterality: N/A;    Social History   Socioeconomic History   Marital status: Married    Spouse name: Not on file   Number of children: 1   Years of education: Not on file   Highest education level: Not on file  Occupational History   Occupation: truck  driver long distance    Employer: [employer]  Tobacco Use   Smoking status: Every Day   Smokeless tobacco: Never  Substance and Sexual Activity   Alcohol use: No   Drug use: No   Sexual activity: Not on file  Other Topics Concern   Not on file  Social History Narrative   Not on file   Social Drivers of Health   Financial Resource Strain: Not on file  Food Insecurity: Not on file  Transportation Needs: Not on file  Physical Activity: Not on file  Stress: Not on file  Social Connections: Not on file  Intimate Partner Violence: Not on file    Family History  Problem Relation Age of Onset   Diabetes Other     No Known Allergies  Review of Systems  All other systems reviewed and are negative.      Objective:   BP (!) 148/84   Pulse 93   Ht 6' 1 (1.854 m)   Wt 200 lb (90.7 kg)   SpO2 95%   BMI 26.39 kg/m   Vitals:   01/03/24 1522  BP: (!) 148/84  Pulse: 93  Height: 6' 1 (1.854 m)  Weight: 200 lb (90.7 kg)  SpO2: 95%  BMI (Calculated): 26.39    Physical Exam Vitals and nursing note reviewed.  Constitutional:      Appearance: Normal appearance. He  is normal weight.  Eyes:     Pupils: Pupils are equal, round, and reactive to light.  Cardiovascular:     Rate and Rhythm: Normal rate and regular rhythm.     Pulses: Normal pulses.     Heart sounds: Normal heart sounds.  Pulmonary:     Effort: Pulmonary effort is normal.     Breath sounds: Normal breath sounds.  Neurological:     General: No focal deficit present.     Mental Status: He is alert.  Psychiatric:        Mood and Affect: Mood normal.        Behavior: Behavior normal.        Thought Content: Thought content normal.        Judgment: Judgment normal.      No results found for any visits on 01/03/24.  No results found for this or any previous visit (from the past 2160 hours).     Assessment & Plan Type 2 diabetes mellitus with hyperglycemia, without long-term current use of  insulin (HCC) Continue current diabetes POC, as patient has been well controlled on current regimen.  Will adjust meds if needed based on labs.   Mixed hyperlipidemia Pure hypercholesterolemia Checking labs today.  Continue current therapy for lipid control. Will modify as needed based on labwork results.   -CMP w/eGFR -Lipid Panel  Essential hypertension, benign Blood pressure well controlled with current medications.  Continue current therapy.  Will reassess at follow up.   - CBC w/Diff - CMP w/eGFR  Elevated prostate specific antigen (PSA) Checking labs today.  Will call with results when available.   Vitamin D  deficiency B12 deficiency Checking labs today.  Will continue supplements as needed.   - Vitamin D  - Vitamin B12 - TSH  Screening for colon cancer Patient has opted for Cologuard Test for colon cancer screening. Test ordered today.   Will contact pt with results when available.       No follow-ups on file.   Total time spent: 30 minutes  ALAN CHRISTELLA ARRANT, FNP  01/03/2024   This document may have been prepared by [facility] Voice Recognition software and as such may include unintentional dictation errors.

## 2024-01-04 LAB — HEMOGLOBIN A1C
Est. average glucose Bld gHb Est-mCnc: 209 mg/dL
Hgb A1c MFr Bld: 8.9 % — ABNORMAL HIGH (ref 4.8–5.6)

## 2024-01-04 LAB — CMP14+EGFR
ALT: 22 IU/L (ref 0–44)
AST: 18 IU/L (ref 0–40)
Albumin: 4.4 g/dL (ref 3.8–4.9)
Alkaline Phosphatase: 111 IU/L (ref 44–121)
BUN/Creatinine Ratio: 19 (ref 9–20)
BUN: 18 mg/dL (ref 6–24)
Bilirubin Total: 0.2 mg/dL (ref 0.0–1.2)
CO2: 19 mmol/L — ABNORMAL LOW (ref 20–29)
Calcium: 9.6 mg/dL (ref 8.7–10.2)
Chloride: 100 mmol/L (ref 96–106)
Creatinine, Ser: 0.93 mg/dL (ref 0.76–1.27)
Globulin, Total: 2.2 g/dL (ref 1.5–4.5)
Glucose: 235 mg/dL — ABNORMAL HIGH (ref 70–99)
Potassium: 5 mmol/L (ref 3.5–5.2)
Sodium: 141 mmol/L (ref 134–144)
Total Protein: 6.6 g/dL (ref 6.0–8.5)
eGFR: 96 mL/min/{1.73_m2} (ref >59)

## 2024-01-04 LAB — LIPID PANEL
Chol/HDL Ratio: 3.4 ratio (ref 0.0–5.0)
Cholesterol, Total: 145 mg/dL (ref 100–199)
HDL: 43 mg/dL (ref >39)
LDL Chol Calc (NIH): 47 mg/dL (ref 0–99)
Triglycerides: 365 mg/dL — ABNORMAL HIGH (ref 0–149)
VLDL Cholesterol Cal: 55 mg/dL — ABNORMAL HIGH (ref 5–40)

## 2024-01-04 LAB — PSA: Prostate Specific Ag, Serum: 0.6 ng/mL (ref 0.0–4.0)

## 2024-01-04 LAB — VITAMIN B12: Vitamin B-12: 473 pg/mL (ref 232–1245)

## 2024-01-04 LAB — VITAMIN D 25 HYDROXY (VIT D DEFICIENCY, FRACTURES): Vit D, 25-Hydroxy: 19.3 ng/mL — ABNORMAL LOW (ref 30.0–100.0)

## 2024-01-04 LAB — TSH: TSH: 1.51 u[IU]/mL (ref 0.450–4.500)

## 2024-01-07 ENCOUNTER — Ambulatory Visit: Payer: Self-pay

## 2024-01-07 ENCOUNTER — Other Ambulatory Visit: Payer: Self-pay

## 2024-01-07 ENCOUNTER — Encounter: Payer: Self-pay | Admitting: Family

## 2024-01-07 MED ORDER — IRBESARTAN 150 MG PO TABS: 150.0000 mg | ORAL_TABLET | Freq: Every day | ORAL | 1 refills | Status: DC

## 2024-01-07 MED ORDER — VITAMIN D (ERGOCALCIFEROL) 1.25 MG (50000 UNIT) PO CAPS: 50000.0000 [IU] | ORAL_CAPSULE | ORAL | 3 refills | Status: AC

## 2024-02-10 ENCOUNTER — Other Ambulatory Visit: Payer: Self-pay

## 2024-02-10 MED ORDER — GLIPIZIDE ER 10 MG PO TB24: 10.0000 mg | ORAL_TABLET | Freq: Every day | ORAL | 1 refills | Status: DC

## 2024-02-10 MED ORDER — DAPAGLIFLOZIN PROPANEDIOL 10 MG PO TABS
10.0000 mg | ORAL_TABLET | Freq: Every day | ORAL | 3 refills | Status: DC
Start: 2024-02-10 — End: 2024-05-07

## 2024-03-05 ENCOUNTER — Other Ambulatory Visit: Payer: Self-pay

## 2024-03-05 MED ORDER — ATORVASTATIN CALCIUM 20 MG PO TABS: 20.0000 mg | ORAL_TABLET | Freq: Every day | ORAL | 1 refills | Status: DC

## 2024-03-21 ENCOUNTER — Encounter: Payer: Self-pay | Admitting: Family

## 2024-03-21 NOTE — Assessment & Plan Note (Signed)
 Checking labs today.  Will continue supplements as needed.   - Vitamin D  - Vitamin B12 - TSH

## 2024-03-21 NOTE — Assessment & Plan Note (Signed)
 Blood pressure well controlled with current medications.  Continue current therapy.  Will reassess at follow up.   - CBC w/Diff - CMP w/eGFR

## 2024-03-21 NOTE — Assessment & Plan Note (Signed)
 Checking labs today.  Continue current therapy for lipid control. Will modify as needed based on labwork results.   -CMP w/eGFR -Lipid Panel

## 2024-03-21 NOTE — Assessment & Plan Note (Signed)
 Continue current diabetes POC, as patient has been well controlled on current regimen.  Will adjust meds if needed based on labs.

## 2024-05-07 ENCOUNTER — Other Ambulatory Visit: Payer: Self-pay

## 2024-05-07 MED ORDER — DAPAGLIFLOZIN PROPANEDIOL 10 MG PO TABS: 10.0000 mg | ORAL_TABLET | Freq: Every day | ORAL | 3 refills | Status: DC

## 2024-06-12 ENCOUNTER — Other Ambulatory Visit: Payer: Self-pay

## 2024-06-12 MED ORDER — METFORMIN HCL 1000 MG PO TABS: 1000.0000 mg | ORAL_TABLET | Freq: Two times a day (BID) | ORAL | 3 refills | Status: DC

## 2024-07-02 ENCOUNTER — Other Ambulatory Visit: Payer: Self-pay

## 2024-07-02 MED ORDER — ALBUTEROL SULFATE HFA 108 (90 BASE) MCG/ACT IN AERS: 1.0000 | INHALATION_SPRAY | Freq: Four times a day (QID) | RESPIRATORY_TRACT | 6 refills | Status: AC | PRN

## 2024-07-02 MED ORDER — AMLODIPINE BESYLATE 5 MG PO TABS: 5.0000 mg | ORAL_TABLET | Freq: Every day | ORAL | 1 refills | Status: AC

## 2024-07-02 MED ORDER — DAPAGLIFLOZIN PROPANEDIOL 10 MG PO TABS
10.0000 mg | ORAL_TABLET | Freq: Every day | ORAL | 3 refills | Status: AC
Start: 2024-07-02 — End: ?

## 2024-07-02 MED ORDER — METFORMIN HCL 1000 MG PO TABS: 1000.0000 mg | ORAL_TABLET | Freq: Two times a day (BID) | ORAL | 3 refills | Status: AC

## 2024-07-02 MED ORDER — GLIPIZIDE ER 10 MG PO TB24
10.0000 mg | ORAL_TABLET | Freq: Every day | ORAL | 1 refills | Status: AC
Start: 2024-07-02 — End: ?

## 2024-07-02 MED ORDER — ATORVASTATIN CALCIUM 20 MG PO TABS: 20.0000 mg | ORAL_TABLET | Freq: Every day | ORAL | 1 refills | Status: AC

## 2024-07-02 MED ORDER — MOUNJARO 2.5 MG/0.5ML ~~LOC~~ SOAJ: 2.5000 mg | SUBCUTANEOUS | 0 refills | Status: AC

## 2024-08-18 ENCOUNTER — Other Ambulatory Visit: Payer: Self-pay

## 2024-08-18 MED ORDER — IRBESARTAN 300 MG PO TABS: 300.0000 mg | ORAL_TABLET | Freq: Every day | ORAL | 3 refills | Status: AC
